# Patient Record
Sex: Female | Born: 1965
Health system: Southern US, Community
[De-identification: ages and names within clinical notes are randomized; demographics above are authoritative.]

## PROBLEM LIST (undated history)

## (undated) DIAGNOSIS — E785 Hyperlipidemia, unspecified: Secondary | ICD-10-CM

## (undated) DIAGNOSIS — F429 Obsessive-compulsive disorder, unspecified: Secondary | ICD-10-CM

## (undated) DIAGNOSIS — F32A Depression, unspecified: Secondary | ICD-10-CM

## (undated) DIAGNOSIS — F329 Major depressive disorder, single episode, unspecified: Secondary | ICD-10-CM

## (undated) DIAGNOSIS — E119 Type 2 diabetes mellitus without complications: Secondary | ICD-10-CM

## (undated) HISTORY — PX: ENDOMETRIAL ABLATION: SHX621

## (undated) HISTORY — DX: Depression, unspecified: F32.A

## (undated) HISTORY — DX: Type 2 diabetes mellitus without complications: E11.9

## (undated) HISTORY — DX: Hyperlipidemia, unspecified: E78.5

## (undated) HISTORY — DX: Major depressive disorder, single episode, unspecified: F32.9

## (undated) HISTORY — DX: Obsessive-compulsive disorder, unspecified: F42.9

## (undated) HISTORY — PX: TUBAL LIGATION: SHX77

---

## 2006-03-12 ENCOUNTER — Other Ambulatory Visit: Admission: RE | Admit: 2006-03-12 | Discharge: 2006-03-12 | Payer: Self-pay | Admitting: Gynecology

## 2010-01-13 ENCOUNTER — Emergency Department (HOSPITAL_COMMUNITY): Admission: EM | Admit: 2010-01-13 | Discharge: 2010-01-13 | Payer: Self-pay | Admitting: Family Medicine

## 2011-02-05 LAB — POCT URINALYSIS DIP (DEVICE)
Bilirubin Urine: NEGATIVE
Glucose, UA: 500 mg/dL — AB
Nitrite: NEGATIVE

## 2011-02-05 LAB — POCT I-STAT, CHEM 8
Hemoglobin: 13.6 g/dL (ref 12.0–15.0)
Sodium: 130 mEq/L — ABNORMAL LOW (ref 135–145)
TCO2: 27 mmol/L (ref 0–100)

## 2011-02-05 LAB — GLUCOSE, CAPILLARY: Glucose-Capillary: 418 mg/dL — ABNORMAL HIGH (ref 70–99)

## 2015-05-31 ENCOUNTER — Other Ambulatory Visit: Payer: Self-pay | Admitting: Family Medicine

## 2015-05-31 ENCOUNTER — Ambulatory Visit
Admission: RE | Admit: 2015-05-31 | Discharge: 2015-05-31 | Disposition: A | Discharge status: Home / Self Care | Payer: 59 | Source: Ambulatory Visit | Attending: Family Medicine | Admitting: Family Medicine

## 2015-05-31 DIAGNOSIS — Z7722 Contact with and (suspected) exposure to environmental tobacco smoke (acute) (chronic): Secondary | ICD-10-CM

## 2015-06-03 ENCOUNTER — Ambulatory Visit (INDEPENDENT_AMBULATORY_CARE_PROVIDER_SITE_OTHER): Payer: 59

## 2015-06-03 ENCOUNTER — Ambulatory Visit (INDEPENDENT_AMBULATORY_CARE_PROVIDER_SITE_OTHER): Payer: 59 | Admitting: Podiatry

## 2015-06-03 ENCOUNTER — Encounter: Payer: Self-pay | Admitting: Podiatry

## 2015-06-03 VITALS — BP 126/80 | HR 77 | Resp 12

## 2015-06-03 DIAGNOSIS — Q667 Congenital pes cavus: Secondary | ICD-10-CM

## 2015-06-03 DIAGNOSIS — M779 Enthesopathy, unspecified: Secondary | ICD-10-CM

## 2015-06-03 DIAGNOSIS — M216X9 Other acquired deformities of unspecified foot: Secondary | ICD-10-CM

## 2015-06-03 DIAGNOSIS — M2040 Other hammer toe(s) (acquired), unspecified foot: Secondary | ICD-10-CM | POA: Diagnosis not present

## 2015-06-03 DIAGNOSIS — R52 Pain, unspecified: Secondary | ICD-10-CM

## 2015-06-03 DIAGNOSIS — M79673 Pain in unspecified foot: Secondary | ICD-10-CM

## 2015-06-03 NOTE — Progress Notes (Signed)
   Subjective:    Patient ID: Brooke Shah, female    DOB: 1966/05/06, 49 y.o.   MRN: [id number]  HPI  49 year old female presents the office with complaints of bilateral fourth toe contractures and pain to the top of the fourth toe. His his been ongoing for approximately 2 years and has been progressive. She gets irritation top the toe from shoe gear. He gets some pain in the top of her right foot which has been ongoing for several months. She denies any history of injury or trauma. Denies any swelling or any redness over the area. She is diabetic and last HbA1c was 9.4. 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 She said no prior treatment for this. No other complaints at this time.  Review of Systems  Constitutional: Positive for unexpected weight change.  All other systems reviewed and are negative.      Objective:   Physical Exam AAO 3, NAD DP/PT pulses palpable, CRT less than 3 seconds Protective sensation intact with Simms Weinstein monofilament, vibratory sensation intact, Achilles tendon reflex intact. There is semirigid hammertoe contractures to bilateral fourth digit with slight derotation over the dorsal aspect of the PIPJ bilaterally. There is hammertoe contractures of the lesser digits however they've noticed that the fourth toes. There is mild discomfort along the dorsal aspect of the right foot on the extensor tendons although the subjectively is improving of the last couple weeks. There is no area pinpoint bony tenderness or pain the vibratory sensation of bilateral lower extremity is. There is an increase in medial arch height upon weightbearing. There is high-pressure areas present bilateral submetatarsal one and 5 with mild hyperkeratotic lesions. There is no surrounding erythema or drainage. MMT 5/5, ROM WNL No open lesions or pre-ulcerative lesions identified elsewhere. No pain with calf compression, swelling, warmth,  erythema.     Assessment & Plan:  49 year old female fourth digit hammertoe contractures bilaterally; , likely extensor tendinitis due to foot type in shoe gear. -X-rays were obtained and reviewed with the patient.  -Treatment options discussed including all alternatives, risks, and complications -I discussed both conservative and surgical treatment options for bilateral hammertoe correction. However her A1c is 9.4 so would not perform surgery with her sugar being is elevated. For now continue conservative treatment including shoe gear modifications, padding and offloading. -Discussed likely etiology of the dorsal foot pain on the right side. This is likely due to tendinitis. She has an increase in arch height. She wears flat shoes or high heels. Discussed with her orthotics to help support her foot take pressure off of this area. May long discussion about this. -Follow-up after she gets orthotics and tries padding for some time. If symptoms aren't resolving in the next 4-6 weeks to call the office for follow-up appointment or sooner if any problems are to arise. In the meantime I encouraged her to call the office with any questions, concerns, changes symptoms.  Ovid Curd, DPM

## 2015-07-22 ENCOUNTER — Encounter: Payer: Self-pay | Admitting: Podiatry

## 2015-07-22 ENCOUNTER — Ambulatory Visit (INDEPENDENT_AMBULATORY_CARE_PROVIDER_SITE_OTHER): Payer: 59 | Admitting: Podiatry

## 2015-07-22 VITALS — BP 114/77 | HR 87 | Resp 18

## 2015-07-22 DIAGNOSIS — M2040 Other hammer toe(s) (acquired), unspecified foot: Secondary | ICD-10-CM

## 2015-07-22 DIAGNOSIS — M216X9 Other acquired deformities of unspecified foot: Secondary | ICD-10-CM

## 2015-07-22 DIAGNOSIS — Q667 Congenital pes cavus: Secondary | ICD-10-CM | POA: Diagnosis not present

## 2015-07-22 DIAGNOSIS — M79673 Pain in unspecified foot: Secondary | ICD-10-CM

## 2015-07-25 NOTE — Progress Notes (Signed)
Patient ID: Brooke Shah, female   DOB: 1966/11/07, 49 y.o.   MRN: [id number]  Subjective: 49 year old female presents the office they for follow-up evaluation of bilateral fourth toe pain which she says is doing better since last appointment. She says the areas continue to curl although they are not as painful. She also gets mild diffuse tenderness to the top of her foot along the midfoot particularly when walking or standing for prolonged periods of time or working as a Financial controller. Denies any swelling or redness. No tingling or numbness. No recent injury or trauma. She has purchased an over-the-counter insert for her sneakers although she states that she cannot wear them in her dress shoes or shoes that she works in. Denies any systemic complaints such as fevers, chills, nausea, vomiting. No acute changes since last appointment, and no other complaints at this time.   Objective: AAO x3, NAD DP/PT pulses palpable bilaterally, CRT less than 3 seconds Protective sensation intact with Dorann Ou monofilament There is an increase in calcaneal inclination angle bilaterally. There is mild diffuse tenderness along the midfoot without any specific pinpoint bony tenderness or pain the vibratory sensation. There is adductovarus the bilateral fourth digits with slight irritation around the PIPJ from shoe gear. There is no tenderness to palpation upon the toes at this time. No other areas of pinpoint bony tenderness or pain with vibratory sensation. MMT 5/5, ROM WNL. No edema, erythema, increase in warmth to bilateral lower extremities.  No open lesions or pre-ulcerative lesions.  No pain with calf compression, swelling, warmth, erythema  Assessment: 49 year old female bilateral foot pain likely result of biomechanical changes/cavus foot, resolving fourth toe pain  Plan: -All treatment options discussed with the patient including all alternatives, risks, complications. I discussed both  conservative and surgical treatment options. -Again discussed etiology of her symptoms. -I believe that she wouldn't mostly benefit from a custom orthotic. I discussed with her a dress orthotic as well. I believe this would help her with her foot type. She is difficult position having to wear high-heeled and flat shoes at work -Follow-up after trying orthotics in dress shoes if she is able to purchase them or sooner if any problems are to arise. In the meantime call the office with any questions, concerns, changes symptoms.  Ovid Curd, DPM

## 2016-05-31 LAB — HEPATIC FUNCTION PANEL
ALT: 16 U/L (ref 7–35)
AST: 16 U/L (ref 13–35)
BILIRUBIN, TOTAL: 0.6 mg/dL

## 2016-05-31 LAB — BASIC METABOLIC PANEL
BUN: 13 mg/dL (ref 4–21)
CREATININE: 0.7 mg/dL (ref 0.5–1.1)
Glucose: 130 mg/dL
POTASSIUM: 5 mmol/L (ref 3.4–5.3)
Sodium: 138 mmol/L (ref 137–147)

## 2016-05-31 LAB — LIPID PANEL
CHOLESTEROL: 189 mg/dL (ref 0–200)
HDL: 3 mg/dL — AB (ref 35–70)
TRIGLYCERIDES: 68 mg/dL (ref 40–160)

## 2016-05-31 LAB — MICROALBUMIN, URINE: Microalb, Ur: 0.7

## 2016-05-31 LAB — HEMOGLOBIN A1C: Hemoglobin A1C: 8.6

## 2016-07-19 ENCOUNTER — Ambulatory Visit (INDEPENDENT_AMBULATORY_CARE_PROVIDER_SITE_OTHER): Payer: 59 | Admitting: Internal Medicine

## 2016-07-19 VITALS — BP 118/82 | HR 87 | Ht 62.0 in | Wt 150.0 lb

## 2016-07-19 DIAGNOSIS — E1065 Type 1 diabetes mellitus with hyperglycemia: Principal | ICD-10-CM

## 2016-07-19 DIAGNOSIS — E109 Type 1 diabetes mellitus without complications: Secondary | ICD-10-CM

## 2016-07-19 DIAGNOSIS — IMO0001 Reserved for inherently not codable concepts without codable children: Secondary | ICD-10-CM

## 2016-07-19 LAB — POCT GLYCOSYLATED HEMOGLOBIN (HGB A1C): HEMOGLOBIN A1C: 8.3

## 2016-07-19 MED ORDER — GLUCAGON (RDNA) 1 MG IJ KIT
1.0000 mg | PACK | Freq: Once | INTRAMUSCULAR | 12 refills | Status: DC | PRN
Start: 2016-07-19 — End: 2017-08-26

## 2016-07-19 NOTE — Patient Instructions (Addendum)
Please change the pump settings as follows: - basal rates: 12 AM: 0.45 units/h >> 0.6 7 AM: 0.80 11 AM: 0.70 6 PM: 0.90 10 PM: 0.75 - ICR: 12, but from 5-9 pm: 11 - target: 110-130 >> 110-120 - ISF: 65 >> 55 - Insulin on Board: 4 hours  Please return in 1.5 months with your sugar log.   Please schedule an appt with Cristy Folks for diabetes education.  Basic Rules for Patients with Type I Diabetes Mellitus  1. The American Diabetes Association (ADA) recommended targets: - fasting sugar <130 - after meal sugar <180 - HbA1C <7%  2. Engage in ?150 min moderate exercise per week  3. Make sure you have ?8h of sleep every night as this helps both blood sugars and your weight.  4. Always keep a sugar log (not only record in your meter) and bring it to all appointments with [place].  5. If you are on a pump, know how to access the settings and to modify the parameters.  6.  Remember, you can always call the number on the back of the pump for emergencies related to the pump.  7. "15-15 rule" for hypoglycemia: if sugars are low, take 15 g of carbs** ("fast sugar" - e.g. 4 glucose tablets, 4 oz orange juice), wait 15 min, then check sugars again. If still <80, repeat. Continue  until your sugars >80, then eat a normal meal.   8. Teach family members and coworkers to inject glucagon. Have a glucagon set at home and one at work. They should call 911 after using the set.  9. If you are on a pump, set "insulin on board" time for 5 hours (if your sugars tend to be higher, can use 4 hours).   10. If you are on a pump, use the "dual wave bolus" setting for high fat foods (e.g. pizza). Start with a setting of 50%-50% (50% instant bolus and 50% prolonged bolus over 3h, for e.g.).    11. If you are on a pump, make sure the basal daily insulin dose is approximately equal (not larger) to the daily insulin you get from boluses, otherwise you are at risk for hypoglycemia.  12. Check sugar before  driving. If <100, correct, and only start driving if sugars rise ?960. Check sugar every hour when on a long drive.  13. Check sugar before exercising. If <100, correct, and only start exercising if sugars rise ?100. Check sugar every hour when on a long exercise routine and 1h after you finished exercising.   If >250, check urine for ketones. If you have moderate-large ketones in urine, do not start exercise. Hydrate yourself with clear liquids and correct the high sugar. Recheck sugars and ketones before attempting to exercise.  Be aware that you might need less insulin when exercising.  *intense, short, exercise bursts can increase your sugars, but  *less intense, longer (>1h), exercise routines can decrease your sugars.  If you are on a pump, you might need to decrease your basal rate by 10% or more (or even disconnect your pump) while you exercise to prevent low sugars. Do not disconnect your pump by more than 3 hours at a time! You also might need to decrease your insulin bolus for the meal prior to your exercise time by 20% or more.  14. Make sure you have a MedAlert bracelet or pendant mentioning "Type I Diabetes Mellitus". If you have a prior episode of severe hypoglycemia or hypoglycemia unawareness, it should  also mention this.  15. Please do not walk barefoot. Inspect your feet for sores/cuts and let us know if you have them.  16. Please call [facility] with any questions and concerns [phone]).   **E.g. of "fast carbs": ? first choice (15 g):  1 tube glucose gel, GlucoPouch 15, 2 oz glucose liquid ? second choice (15-16 g):  3 or 4 glucose tablets (best taken  with water), 15 Dextrose Bits chewable ? third choice (15-20 g):   cup fruit juice,  cup regular soda, 1 cup skim milk,  1 cup sports drink ? fourth choice (15-20 g):  1 small tube [place] gel (not frosting), 2 tbsp raisins, 1 tbsp table sugar,  candy, jelly beans, gum drops - check package for  carb amount   (adapted from: Juluis RainierMcCall A.L. "Insulin therapy and hypoglycemia" Endocrinol Metab Clin N Am 2012, 41: 57-87)

## 2016-07-19 NOTE — Progress Notes (Signed)
Patient ID: Brooke Shah, female   DOB: 07-12-1966, 50 y.o.   MRN: [id number]  HPI: Brooke Shah is a 50 y.o.-year-old female, referred by her PCP, Brooke Huh, MD, for management of DM1, dx 2004, uncontrolled, without complications.  Patient has been diagnosed with diabetes in 2004; she started on insulin pump 2006.  Last hemoglobin A1c was: 01/11/2016: HbA1c 8.7% 09/29/2015: HbA1c 8.9% 09/28/2014: HbA1c 9.8%  She had positive pancreatic antibodies.   Pt is on an insulin pump: [employer], with Dexcom CGM (not on now), uses Humalog in the pump.  Pump settings: - basal rates: 12 AM: 0.45 units/h 7 AM: 0.80 11 AM: 0.70 6 PM: 0.90 10 PM: 0.75 - ICR: 12 - target: 110-130 - ISF: 65 - Insulin on Board: 4 hours - bolus wizard: on TDD from basal insulin: 57% (16.3 units) TDD from bolus insulin: 43% (12.1 units) - extended bolusing: not using - changes infusion site: q5-6 days! - Meter: One Touch Unltra link  Pt checks her sugars 3x a day and they are (will also scan report): - am: 132, 161-315 - 2h after b'fast: 115-198 - before lunch: Usually not checking - 2h after lunch: 215-265 - before dinner: 69, 127, 236-361 - 2h after dinner: 111, 362, 383 - bedtime: 108, 148- 359 - nighttime: n/c No lows. Lowest sugar was 33 >> but 50s recently; she has hypoglycemia awareness at 70. No previous hypoglycemia admission. Does not have a glucagon kit at home. Highest sugar was 300s No previous DKA admissions.    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  - no CKD, last BUN/creatinine:  07/19/2015: 11/0.68, normal LFTs Lab Results  Component Value Date   BUN 16 01/13/2010   CREATININE 0.9 01/13/2010  ACR normal 07/2015 - last set of lipids: 10/04/2015: 195/86/76/102 No results found for: CHOL, HDL, LDLCALC, LDLDIRECT, TRIG, CHOLHDL - last eye exam was in 03/2016. No DR.  - + numbness and tingling in her feet. B12 level 648 07/19/2015.  Last  TSH: 10/04/2015: TSH 0.780, free T4 0.90 No results found for: TSH  No FH of DM.  ROS: Constitutional: + weight gain, no fatigue, no subjective hyperthermia/hypothermia Eyes: no blurry vision, no xerophthalmia ENT: no sore throat, no nodules palpated in throat, no dysphagia/odynophagia, no hoarseness Cardiovascular: no CP/SOB/palpitations/leg swelling Respiratory: no cough/SOB Gastrointestinal: no N/V/D/C Musculoskeletal: no muscle/joint aches Skin: no rashes Neurological: no tremors/numbness/tingling/dizziness Psychiatric: no depression/anxiety  PMH: - DM1 - HL - depression   No past surgical history on file.   Social History   Social History  . Marital status: Married    Spouse name: N/A  . Number of children:    Occupational History  . Flight attendant   Social History Main Topics  . Smoking status: Never Smoker  . Smokeless tobacco: Not on file  . Alcohol use No  . Drug use: No   Current Outpatient Prescriptions on File Prior to Visit  Medication Sig Dispense Refill  . atorvastatin (LIPITOR) 10 MG tablet Take 10 mg by mouth.    . insulin lispro (HUMALOG) 100 UNIT/ML injection Inject into the skin.     No current facility-administered medications on file prior to visit.    No Known Allergies   FH: - see HPI  PE: BP 118/82 (BP Location: Left Arm, Patient Position: Sitting)   Pulse 87   Ht '5\' 2"'  (1.575 m)   Wt 150 lb (68 kg)   SpO2 95%   BMI 27.44 kg/m  Wt Readings  from Last 3 Encounters:  07/20/16 150 lb (68 kg)   Constitutional: overweight, in NAD Eyes: PERRLA, EOMI, no exophthalmos ENT: moist mucous membranes, no thyromegaly, no cervical lymphadenopathy Cardiovascular: RRR, No MRG Respiratory: CTA B Gastrointestinal: abdomen soft, NT, ND, BS+ Musculoskeletal: no deformities, strength intact in all 4 Skin: moist, warm, no rashes Neurological: no tremor with outstretched hands, DTR normal in all 4  ASSESSMENT: 1. DM1, uncontrolled, without  complications  PLAN:  1. Patient with long-standing, uncontrolled DM1, on insulin pump therapy. She was diagnosed with type 1 diabetes later in life, and is still having trouble adapting to this diagnosis. She is on insulin pump, but is not very proficient in working with it, and she would not change settings by herself. Other possible barriers to good control:   She is changing her infusion site every 5 or more days, which I advised her to change to 3, maximum 4 days  She is not entering all the carbs that she eats in the pump  I feel that she has trouble calculating the carbs in her meals  She is not bolusing with all of her meals  She would like to keep her sugars on the high side, for fear of dropping especially overnight  She may overcompensate the lows >> We discussed about the 1515 rule   she is not proficient in extended boluses. We discussed about doing wave and square wave boluses today briefly, but will need more training >> will refer to diabetes education  - As the sugars are high in the morning, even though they may be at goal at bedtime, will increase her basal rates overnight  - Since she has to do multiple boluses to bring the sugars down when hyperglycemic, I will lower her sensitivity factor  - As her sugars are higher after dinner, will lower the ICR with this meal  - will also decrease her hyperglycemic target - HbA1c today: 8.4% (better). - We discussed about changes to his insulin regimen, as follows:  Patient Instructions  Please change the pump settings as follows: - basal rates: 12 AM: 0.45 units/h >> 0.6 7 AM: 0.80 11 AM: 0.70 6 PM: 0.90 10 PM: 0.75 - ICR: 12, but from 5-9 pm: 11 - target: 110-130 >> 110-120 - ISF: 65 >> 55 - Insulin on Board: 4 hours  Please return in 1.5 months with your sugar log.   Please schedule an appt with Brooke Shah for diabetes education.  - Continue checking sugars at different times of the day - check at least 4-6  times a day, rotating checks.  - given foot care handout and explained the principles  - given instructions for hypoglycemia management "15-15 rule"  - advised for yearly eye exams - sent 2x glucagon kit Rx's to pharmacy - advised to get ketone strips - advised to always have Glu tablets with her - advised for a Med-alert bracelet mentioning "type 1 diabetes mellitus". - will refer to DM education for further help with the pump: carb counting check, basal rate validation, extended bolusing, sick days rules, etc. - given instruction Re: exercising and driving in DM1 (pt instructions) - no signs of other autoimmune disorders - Return to clinic in 1.5 mo with sugar log   - time spent with the patient: 1 hour, of which >50% was spent in obtaining information about herdisease, reviewing previous labs, office visit notes, hospitalization records, and DM treatments, counseling pt about her condition (please see the discussed topics above), and  developing a plan to prevent further hypoglycemia and hyperglycemia. We also discussed about proper diet. Pt had a number of questions which I addressed.  Received labs from PCP after the visit: 05/31/2016: - HbA1c 8.6% - BUN/creatinine 13/0.67, GFR 94. Normal AST and ALT. Glucose 130. - ACR unable to calculate (microalbumin <0.7 mg/dL) - Lipids: 189/68/67/108.  Philemon Kingdom, MD PhD [facility]

## 2016-07-20 ENCOUNTER — Encounter: Payer: Self-pay | Admitting: Internal Medicine

## 2016-07-24 ENCOUNTER — Encounter: Payer: 59 | Admitting: Nutrition

## 2016-08-01 ENCOUNTER — Encounter: Payer: 59 | Attending: Internal Medicine | Admitting: Nutrition

## 2016-08-01 DIAGNOSIS — Z713 Dietary counseling and surveillance: Secondary | ICD-10-CM | POA: Diagnosis not present

## 2016-08-01 DIAGNOSIS — IMO0002 Reserved for concepts with insufficient information to code with codable children: Secondary | ICD-10-CM

## 2016-08-01 DIAGNOSIS — E1065 Type 1 diabetes mellitus with hyperglycemia: Secondary | ICD-10-CM

## 2016-08-01 DIAGNOSIS — E108 Type 1 diabetes mellitus with unspecified complications: Secondary | ICD-10-CM

## 2016-08-01 DIAGNOSIS — E109 Type 1 diabetes mellitus without complications: Secondary | ICD-10-CM | POA: Diagnosis not present

## 2016-08-01 NOTE — Patient Instructions (Signed)
Take the amount of insulin the pump says to take for correction and meal boluses Test  Before meals and at bedtime Call results to me on Monday

## 2016-08-01 NOTE — Progress Notes (Signed)
Meter download shows blood sugars for the last week: FBSs: 191-260,  AcL; 259-over 400,  AcS: 211-380 and one reading: 70 HS: 157-300  Blood sugars very verable. Pt. Says she is not taking what the pump tells her to take.  Also complaining that she has gained 40 pounds in the last 2 years.  She believes she has to eat q 2 hours, because she was told that when she was a child.  Discussed that it is not necessary to eat q 2 hours, and it is very important to take what the pump says to take to know if we need to change the settings.  She can see that by reducing the boluses by 0.5-1unit, her blood sugars still remain high.  She promised me to do what the pump says, and to call me with blood sugar readings on Monday morning.

## 2016-09-11 ENCOUNTER — Other Ambulatory Visit: Payer: Self-pay

## 2016-09-11 ENCOUNTER — Ambulatory Visit (INDEPENDENT_AMBULATORY_CARE_PROVIDER_SITE_OTHER): Payer: 59 | Admitting: Internal Medicine

## 2016-09-11 ENCOUNTER — Encounter: Payer: Self-pay | Admitting: Internal Medicine

## 2016-09-11 VITALS — BP 122/84 | HR 88 | Ht 62.0 in | Wt 152.0 lb

## 2016-09-11 DIAGNOSIS — IMO0001 Reserved for inherently not codable concepts without codable children: Secondary | ICD-10-CM

## 2016-09-11 DIAGNOSIS — E1065 Type 1 diabetes mellitus with hyperglycemia: Secondary | ICD-10-CM | POA: Diagnosis not present

## 2016-09-11 MED ORDER — ATORVASTATIN CALCIUM 10 MG PO TABS: ORAL_TABLET | ORAL | 3 refills | Status: DC

## 2016-09-11 MED ORDER — ONETOUCH DELICA LANCETS FINE MISC: 3 refills | Status: DC

## 2016-09-11 MED ORDER — GLUCOSE BLOOD VI STRP: ORAL_STRIP | 3 refills | Status: DC

## 2016-09-11 NOTE — Patient Instructions (Signed)
Please change the settings: - basal rates: 12 AM: 0.6 units/h >> 0.75 7 AM: 0.80 >> 0.90 11 AM: 0.70 >> 0.90 6 PM: 0.90 >> 1.00 10 PM: 0.75 >> 0.85 - ICR: 12 >> 11. If sugars still high after a meal >> decrease further, to 10. but from 5-9 pm: 11 - target: 110-120 - ISF: 55 - Insulin on Board: 4 hours  Please return in 3 months with your sugar log.

## 2016-09-11 NOTE — Progress Notes (Signed)
Patient ID: Brooke Shah, female   DOB: 23-May-1966, 50 y.o.   MRN: [id number]  HPI: Brooke Shah is a 50 y.o.-year-old female, initially referred by her PCP, Brooke Huh, MD, for management of DM1, dx 2004, uncontrolled, without complications. Last visit 2 mo ago.  Patient has been diagnosed with diabetes in 2004; she started on insulin pump 2006 - new pump 10/16/2013: Revel 523.  Last hemoglobin A1c was: Lab Results  Component Value Date   HGBA1C 8.3 07/19/2016   HGBA1C 8.6 05/31/2016  05/31/2016: HbA1c 8.6% 01/11/2016: HbA1c 8.7% 09/29/2015: HbA1c 8.9% 09/28/2014: HbA1c 9.8%  She had positive pancreatic antibodies.   Pt is on an insulin pump: Medtronic, with Dexcom CGM (not on now), uses Humalog in the pump.  Pump settings: - basal rates: 12 AM: 0.45 units/h >> 0.6 7 AM: 0.80 11 AM: 0.70 6 PM: 0.90 10 PM: 0.75 - ICR: 12, but from 5-9 pm: 11 - target: 110-130 >> 110-120 - ISF: 65 >> 55 - Insulin on Board: 4 hours TDD from basal insulin: 47% (17.4 units) TDD from bolus insulin: 53% (19.8 units) - extended bolusing: not using - changes infusion site: q5-6 days! >> q3-4 days now - Meter: One Touch Ultra link  Pt checks her sugars 3x a day and they are (will also scan report) >> high ave: 238 +/- 86: - am: 132, 161-315 >> 139-284 - 2h after b'fast: 115-198 >> 74 x1 - before lunch: Usually not checking >> 118-388 - 2h after lunch: 215-265 >> 136-309 - before dinner: 69, 127, 236-361 >> 71, 118-332 - 2h after dinner: 111, 362, 383 >> 125-361 - bedtime: 108, 148- 359 >> see above - nighttime: n/c No lows. Lowest sugar was 33 >> 50s >> 70s; she has hypoglycemia awareness at 70. No previous hypoglycemia admission. Does not have a glucagon kit at home. Highest sugar was 300s. No previous DKA admissions.    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  - no CKD, last BUN/creatinine:  05/31/2016: 13/0.67, GFR 94. Normal AST and ALT. Glucose 130, ACR  unable to calculate (microalbumin <0.7 mg/dL) 07/19/2015: 11/0.68, normal LFTs Lab Results  Component Value Date   BUN 13 05/31/2016   BUN 16 01/13/2010   CREATININE 0.7 05/31/2016   CREATININE 0.9 01/13/2010  ACR normal 07/2015 - last set of lipids: 05/31/2016: 189/68/67/108. 10/04/2015: 195/86/76/102 Lab Results  Component Value Date   CHOL 189 05/31/2016   HDL 3 (A) 05/31/2016   TRIG 68 05/31/2016   - last eye exam was in 03/2016. No DR.  - + numbness and tingling in her feet. B12 level 648 07/19/2015.  Last TSH: 10/04/2015: TSH 0.780, free T4 0.90 No results found for: TSH  No FH of DM.  ROS: Constitutional: + weight gain, no fatigue, no subjective hyperthermia/hypothermia Eyes: no blurry vision, no xerophthalmia ENT: no sore throat, no nodules palpated in throat, no dysphagia/odynophagia, no hoarseness Cardiovascular: no CP/SOB/palpitations/leg swelling Respiratory: no cough/SOB Gastrointestinal: no N/V/D/C Musculoskeletal: no muscle/joint aches Skin: no rashes Neurological: no tremors/numbness/tingling/dizziness  I reviewed pt's medications, allergies, PMH, social hx, family hx, and changes were documented in the history of present illness. Otherwise, unchanged from my initial visit note.  PMH: - DM1 - HL - depression   No past surgical history on file.   Social History   Social History  . Marital status: Married    Spouse name: N/A  . Number of children:    Occupational History  . Flight attendant  Social History Main Topics  . Smoking status: Never Smoker  . Smokeless tobacco: Not on file  . Alcohol use No  . Drug use: No   Current Outpatient Prescriptions on File Prior to Visit  Medication Sig Dispense Refill  . ALPRAZolam (XANAX) 0.5 MG tablet     . atorvastatin (LIPITOR) 10 MG tablet Take 10 mg by mouth.    . Clindamycin-Benzoyl Per, Refr, gel     . insulin lispro (HUMALOG) 100 UNIT/ML injection Inject subcutaneously 100  units (76m)  daily (load  pump with 300 units every 3 days)    . Phentermine-Topiramate 3.75-23 MG CP24 Take by mouth.    . sertraline (ZOLOFT) 100 MG tablet     . valACYclovir (VALTREX) 1000 MG tablet     . glucagon (GLUCAGON EMERGENCY) 1 MG injection Inject 1 mg into the muscle once as needed. (Patient not taking: Reported on 09/11/2016) 2 each 12  . insulin lispro (HUMALOG) 100 UNIT/ML injection Inject into the skin.     No current facility-administered medications on file prior to visit.    No Known Allergies   FH: - see HPI  PE: BP 122/84 (BP Location: Left Arm, Patient Position: Sitting)   Pulse 88   Ht '5\' 2"'  (1.575 m)   Wt 152 lb (68.9 kg)   SpO2 97%   BMI 27.80 kg/m  Wt Readings from Last 3 Encounters:  09/11/16 152 lb (68.9 kg)  07/20/16 150 lb (68 kg)   Constitutional: overweight, in NAD Eyes: PERRLA, EOMI, no exophthalmos ENT: moist mucous membranes, no thyromegaly, no cervical lymphadenopathy Cardiovascular: RRR, No MRG Respiratory: CTA B Gastrointestinal: abdomen soft, NT, ND, BS+ Musculoskeletal: no deformities, strength intact in all 4 Skin: moist, warm, no rashes Neurological: no tremor with outstretched hands, DTR normal in all 4  ASSESSMENT: 1. DM1, uncontrolled, without complications  PLAN:  1. Patient with long-standing, uncontrolled DM1, on insulin pump therapy. She is not very proficient in change in the settings by herself, but she did this today with minimum supervision. Other possible barriers to good control >> improved:  She was changing her infusion site every 5 or more days >> now every 3, maximum 4 days  She was not entering all the carbs that she eats in the pump >>  now doing much better   At last visit, I felt  that she had trouble calculating the carbs in her meals >> she is using the: "My fitness pal" app  She is not bolusing with all of her meals >> this has also improved   She would like to keep her sugars on the high side, for fear of  dropping especially overnight   She may overcompensate the lows   she is not proficient in extended boluses. We discussed about doing wave and square wave boluses today briefly, but will need more training >> saw LLeonia Reader- As the sugars are high throughout the day >> will again increase her basal rates overnight  - Since she has to do multiple boluses to bring the sugars down when hyperglycemic, I will decrease her b'fast and lunch ICR - also discussed about the new Medtronic pump. She is not very keen on the CGM part, but I really think that the new pump can help her gain better control. However, she still is warranty with this current pump for the one more year. - reviewed last HbA1c: 8.4% (better) - We discussed about changes to his insulin regimen, as follows:  Patient  Instructions  Please change the settings: - basal rates: 12 AM: 0.6 units/h >> 0.75 7 AM: 0.80 >> 0.90 11 AM: 0.70 >> 0.90 6 PM: 0.90 >> 1.00 10 PM: 0.75 >> 0.85 - ICR: 12 >> 11. If sugars still high after a meal >> decrease further, to 10. but from 5-9 pm: 11 - target: 110-120 - ISF: 55 - Insulin on Board: 4 hours  Please return in 3 months with your sugar log.   - Continue checking sugars at different times of the day - check at least 4-6 times a day, rotating checks.  - advised for yearly eye exams - sent 2x glucagon kit Rx's to pharmacy at last visit - refuses a flu shot today - no signs of other autoimmune disorders - Return to clinic in 3 mo with sugar log   - time spent with the patient: 40 min, of which >50% was spent in reviewing her pump downloads, discussing her hypo- and hyper-glycemic episodes, reviewing previous labs and pump settings and developing a plan to avoid hypo- and hyper-glycemia.   Philemon Kingdom, MD PhD [facility] Endocrinology

## 2016-09-12 ENCOUNTER — Telehealth: Payer: Self-pay

## 2016-09-12 ENCOUNTER — Telehealth: Payer: Self-pay | Admitting: Family Medicine

## 2016-09-12 NOTE — Telephone Encounter (Signed)
Sorry to hear that. If she did not overbolus with dinner or overcorrected a high at bedtime, please ask her to back off on the nighttime basal rates from 0.75 to 0.70.

## 2016-09-12 NOTE — Telephone Encounter (Signed)
Patient wanted to let the doctor know her blood sugar level dropped to 44 at 3:30am this morning.

## 2016-09-12 NOTE — Telephone Encounter (Signed)
Called patient and notified on the change of basal rate as patient did not overcorrect. Patient will try to change this and will notify us if any highs or lows occur.

## 2016-09-14 ENCOUNTER — Encounter: Payer: 59 | Attending: Internal Medicine | Admitting: Dietician

## 2016-09-14 ENCOUNTER — Encounter: Payer: Self-pay | Admitting: Dietician

## 2016-09-14 DIAGNOSIS — Z713 Dietary counseling and surveillance: Secondary | ICD-10-CM | POA: Diagnosis present

## 2016-09-14 DIAGNOSIS — E109 Type 1 diabetes mellitus without complications: Secondary | ICD-10-CM | POA: Insufficient documentation

## 2016-09-14 DIAGNOSIS — E1065 Type 1 diabetes mellitus with hyperglycemia: Secondary | ICD-10-CM

## 2016-09-14 NOTE — Progress Notes (Signed)
Diabetes Self-Management Education  Visit Type: Follow-up  Appt. Start Time: 1300 Appt. End Time: 1430  09/14/2016  Brooke Shah, identified by name and date of birth, is a 50 y.o. female with a diagnosis of Diabetes: Type 1. Humalog via insulin pump.  Patient complains of a 25 lb weight gain in the past 2 years.  She stated that she was usually around 133 and reduced to 130 lbs on Invokana then gained weight after she discontinued this medication.  She has recently seen the CDE in this office for help with her pump and insulin education.  She is here today for a review about carbohydrate counting and get an updated meal plan for weight management.  She would like to lose to 135 lbs.  She reports walking for 3 miles for 4-5 times per week but has not lost weight.  Her highest weight is today's weight.  She states that she is a "picky eater" and does not like a lot of meat.  Her A1C has been improving and was 8.3% 07/19/16.  Labs noted to include:  Cholesterol 189, triglycerides 68 and HDL 3 ? Accuracy.  She states that she has started to reduce her snacking and continues at times to not put in all of the carbohydrate that she is eating for fear of a low blood sugar.  Patient lives with her husband and two teen children.  They share shopping and cooking.  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  She at times only gets 5 hours of sleep per night although tries to get more most often.    ASSESSMENT  Height 5\' 2"  (1.575 m), weight 153 lb (69.4 kg). Body mass index is 27.98 kg/m.      Diabetes Self-Management Education - 09/14/16 1316      Visit Information   Visit Type Follow-up     Initial Visit   Diabetes Type Type 1   Are you currently following a meal plan? No   Are you taking your medications as prescribed? No  She states that she has fears of lows and is trying to do better with entering her carb better.   Date Diagnosed 2004   insulin pump since 2006     Health Coping   How would you rate your overall health? Good     Psychosocial Assessment   Patient Belief/Attitude about Diabetes Motivated to manage diabetes   Self-care barriers None   Self-management support Doctor's office;Family   Other persons present Patient   Patient Concerns Nutrition/Meal planning;Weight Control;Glycemic Control   Special Needs None   Preferred Learning Style No preference indicated   Learning Readiness Ready   How often do you need to have someone help you when you read instructions, pamphlets, or other written materials from your doctor or pharmacy? 1 - Never   What is the last grade level you completed in school? 2 years college     Complications   Last HgB A1C per patient/outside source 8.3 %  07/19/16   How often do you check your blood sugar? > 4 times/day   Fasting Blood glucose range (mg/dL) 161-096;[id number];70-129   Postprandial Blood glucose range (mg/dL) >981;191-478>200;180-200   Number of hypoglycemic episodes per month 2   Can you tell when your blood sugar is low? Yes   What do you do if your blood sugar is low? drinks 6-8 ounces juice or eats glucose tabs   Number of hyperglycemic episodes  per week 7   Can you tell when your blood sugar is high? Yes   What do you do if your blood sugar is high? give a correction dose of insulin   Have you had a dilated eye exam in the past 12 months? Yes   Have you had a dental exam in the past 12 months? Yes   Are you checking your feet? Yes   How many days per week are you checking your feet? 7     Dietary Intake   Breakfast cheese toast or protein shake or egg white with toast and tomatoes or occasionally french toast, pancake or waffle OR quick or instant oatmeal with peanut butter   Snack (morning) (P)  occasional almonds or other nuts or fruit or 1/2 protein bar   Lunch (P)  sandwich (PB&J or [place]urkey) OR pizza   Snack (afternoon) (P)  chips or applesauce   Dinner (P)  chicken, rice,  cauliflower OR just vegetables OR protein shake (Juice plus, banana, chia seeds) OR [place]ican foodor italina foods   Snack (evening) (P)  "try not to"- only if blood sugar drops   Beverage(s) (P)  water, diet coke, diet Mt. Dew, green tea     Exercise   Exercise Type (P)  Light (walking / raking leaves)  walking 4-5 times/ week 3.2 miles for past 6 weeks   How many days per week to you exercise? (P)  5   How many minutes per day do you exercise? (P)  60   Total minutes per week of exercise (P)  300     Patient Education   Previous Diabetes Education (P)  Yes (please comment)   Disease state  (P)  --  when diagnosed and 2 months ago     Subsequent Visit   Since your last visit have you continued or begun to take your medications as prescribed? Yes   Since your last visit have you had your blood pressure checked? Yes   Is your most recent blood pressure lower, unchanged, or higher since your last visit? Higher   Since your last visit have you experienced any weight changes? Gain   Weight Gain (lbs) 3   Since your last visit, are you checking your blood glucose at least once a day? Yes      Individualized Plan for Diabetes Self-Management Training:   Learning Objective:  Patient will have a greater understanding of diabetes self-management. Patient education plan is to attend individual and/or group sessions per assessed needs and concerns. Discussed mindful eating.   Plan:   Patient Instructions  Eat slowly and stop when you are satisfied. If you are hungry, a small snack is fine.  It can be just protein or a small portion of protein and a carbohydrate. Before you eat a snack, ask, "Am I hungry or eating for another reason?" Continue your active lifestyle.  Plan:  Aim for 2-3 Carb Choices per meal (30-45 grams) +/- 1 either way  Aim for 0-1 Carbs per snack if hungry  Include protein in moderation with your meals and snacks Consider reading food labels for Total Carbohydrate  and Fat Grams of foods Consider checking BG as directed by MD  Consider taking medication as directed by MD   Expected Outcomes:   Patient expressed interest in learning, good outcome expected  Education material provided: Food label handouts, Meal plan card, My Plate and Snack sheet  If problems or questions, patient to contact team via:  Phone  and Email  Future DSME appointment:  PRN

## 2016-09-14 NOTE — Patient Instructions (Signed)
Eat slowly and stop when you are satisfied. If you are hungry, a small snack is fine.  It can be just protein or a small portion of protein and a carbohydrate. Before you eat a snack, ask, "Am I hungry or eating for another reason?" Continue your active lifestyle.  Plan:  Aim for 2-3 Carb Choices per meal (30-45 grams) +/- 1 either way  Aim for 0-1 Carbs per snack if hungry  Include protein in moderation with your meals and snacks Consider reading food labels for Total Carbohydrate and Fat Grams of foods Consider checking BG as directed by MD  Consider taking medication as directed by MD

## 2016-12-10 LAB — HM DIABETES EYE EXAM

## 2016-12-12 ENCOUNTER — Ambulatory Visit (INDEPENDENT_AMBULATORY_CARE_PROVIDER_SITE_OTHER): Payer: 59 | Admitting: Internal Medicine

## 2016-12-12 ENCOUNTER — Encounter: Payer: Self-pay | Admitting: Internal Medicine

## 2016-12-12 VITALS — BP 122/82 | HR 94 | Ht 62.0 in | Wt 150.0 lb

## 2016-12-12 DIAGNOSIS — E1065 Type 1 diabetes mellitus with hyperglycemia: Secondary | ICD-10-CM | POA: Diagnosis not present

## 2016-12-12 LAB — POCT GLYCOSYLATED HEMOGLOBIN (HGB A1C): HEMOGLOBIN A1C: 8.1

## 2016-12-12 NOTE — Progress Notes (Signed)
Patient ID: Malli Falotico, female   DOB: 1966/06/17, 51 y.o.   MRN: [id number]  HPI: Patricie Geeslin is a 51 y.o.-year-old female, initially referred by her PCP, Thora Lance, MD, for management of DM1, dx 2004, uncontrolled, without complications. Last visit 3 mo ago.  Patient started on insulin pump 2006 - new pump 10/16/2013: Revel 523, with Dexcom CGM (not on now), uses Humalog in the pump.  Last hemoglobin A1c was: Lab Results  Component Value Date   HGBA1C 8.3 07/19/2016   HGBA1C 8.6 05/31/2016  05/31/2016: HbA1c 8.6% 01/11/2016: HbA1c 8.7% 09/29/2015: HbA1c 8.9% 09/28/2014: HbA1c 9.8%  She had positive pancreatic antibodies.   Pump settings: - basal rates: 12 AM: 0.6 units/h >> 0.75 >> 0.70 7 AM: 0.80 >> 0.90 >> 0.80 11 AM: 0.70 >> 0.90 6 PM: 0.90 >> 1.00 >> 0.925 10 PM: 0.75 >> 0.85 >> 0.800 - ICR: 12 >> 11 >> 12 but from 5-9 pm: 12 >> 11 >> 12 - target: 110-120 - ISF: 55 >> 60 - Insulin on Board: 4 hours  TDD from basal insulin: 47% (17.4 units) >> 60% TDD from bolus insulin: 53% (19.8 units) >> 40% - extended bolusing: not using - changes infusion site: q 4-7 days! - Meter: One Touch Ultra link  Pt checks her sugars 3x a day and they are (will also scan report) >> high ave: 238 +/- 86 >> 192 +/- 108 - am: 132, 161-315 >> 139-284 >> 90-275, 371 - 2h after b'fast: 115-198 >> 74 x1 >> n/c - snacks frequently - before lunch: Usually not checking >> 118-388 >> 61, 218-267, 339 - 2h after lunch: 215-265 >> 136-309 >> 159-277 - before dinner: 69, 127, 236-361 >> 71, 118-332 >> 50, 74-305, 373 - 2h after dinner: 111, 362, 383 >> 125-361 >> 55, 164->400 - bedtime: 108, 148- 359 >> see above  - nighttime: n/c >> 37, 60-390 No lows. Lowest sugar was 33 >> 50s >> 70s >> 37-after correction of a high CBG; she has hypoglycemia awareness at 70. No previous hypoglycemia admission.  Highest sugar was 300s >> >400. No previous DKA admissions.    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  - no CKD, last BUN/creatinine:  05/31/2016: 13/0.67, GFR 94. Normal AST and ALT. Glucose 130, ACR unable to calculate (microalbumin <0.7 mg/dL) 78/29/5621: 30/8.65, normal LFTs Lab Results  Component Value Date   BUN 13 05/31/2016   BUN 16 01/13/2010   CREATININE 0.7 05/31/2016   CREATININE 0.9 01/13/2010  - last set of lipids: 05/31/2016: 189/68/67/108. 10/04/2015: 195/86/76/102 Lab Results  Component Value Date   CHOL 189 05/31/2016   HDL 3 (A) 05/31/2016   TRIG 68 05/31/2016   - last eye exam was on 12/10/2016. No DR.  - + numbness and tingling in her feet. B12 level 648 07/19/2015.  Last TSH: 10/04/2015: TSH 0.780, free T4 0.90 No results found for: TSH  Since last visit, started Wellbutrin.  ROS: Constitutional: no weight gain, no fatigue, no subjective hyperthermia/hypothermia Eyes: no blurry vision, no xerophthalmia ENT: no sore throat, no nodules palpated in throat, no dysphagia/odynophagia, no hoarseness Cardiovascular: no CP/SOB/palpitations/leg swelling Respiratory: no cough/SOB Gastrointestinal: no N/V/D/C/+ heartburn Musculoskeletal: no muscle/+ joint aches Skin: no rashes Neurological: no tremors/numbness/tingling/dizziness  I reviewed pt's medications, allergies, PMH, social hx, family hx, and changes were documented in the history of present illness. Otherwise, unchanged from my initial visit note.  PMH: - DM1 - HL - depression   No past  surgical history on file.   Social History   Social History  . Marital status: Married    Spouse name: N/A  . children: yes   Occupational History  . Flight attendant   Social History Main Topics  . Smoking status: Never Smoker  . Smokeless tobacco: Not on file  . Alcohol use No  . Drug use: No   Current Outpatient Prescriptions on File Prior to Visit  Medication Sig Dispense Refill  . ALPRAZolam (XANAX) 0.5 MG tablet     . atorvastatin (LIPITOR) 10 MG tablet  Take one tablet daily. 90 tablet 3  . glucagon (GLUCAGON EMERGENCY) 1 MG injection Inject 1 mg into the muscle once as needed. 2 each 12  . glucose blood (ONE TOUCH ULTRA TEST) test strip Use as instructed to check sugar 3 times daily. 300 each 3  . insulin lispro (HUMALOG) 100 UNIT/ML injection Inject subcutaneously 100  units (1ml) daily (load  pump with 300 units every 3 days)    . magnesium oxide (MAG-OX) 400 MG tablet Take 400 mg by mouth daily.    . Nutritional Supplements (JUICE PLUS FIBRE PO) Take by mouth.    Letta Pate. ONETOUCH DELICA LANCETS FINE MISC Used to check sugar 3 times daily. 300 each 3  . Probiotic Product (PROBIOTIC-10 PO) Take by mouth.    . sertraline (ZOLOFT) 100 MG tablet     . valACYclovir (VALTREX) 1000 MG tablet     . Phentermine-Topiramate 3.75-23 MG CP24 Take by mouth.     No current facility-administered medications on file prior to visit.    No Known Allergies   FH: - see HPI  PE: BP 122/82 (BP Location: Left Arm, Patient Position: Sitting)   Pulse 94   Ht 5\' 2"  (1.575 m)   Wt 150 lb (68 kg)   SpO2 98%   BMI 27.44 kg/m  Wt Readings from Last 3 Encounters:  12/12/16 150 lb (68 kg)  09/14/16 153 lb (69.4 kg)  09/11/16 152 lb (68.9 kg)   Constitutional: overweight, in NAD Eyes: PERRLA, EOMI, no exophthalmos ENT: moist mucous membranes, no thyromegaly, no cervical lymphadenopathy Cardiovascular: RRR, No MRG Respiratory: CTA B Gastrointestinal: abdomen soft, NT, ND, BS+ Musculoskeletal: no deformities, strength intact in all 4 Skin: moist, warm, no rashes Neurological: no tremor with outstretched hands, DTR normal in all 4  ASSESSMENT: 1. DM1, uncontrolled, without complications  PLAN:  1. Patient with long-standing, uncontrolled DM1, on insulin pump therapy. At this visit, her control is slightly better than before, but sugars are still fluctuating. Sugars are highest around lunchtime (usually after lunch), and after dinner. She tries to correct the  high blood sugars and she subsequently develops lows. She tends to over correct the lows and we discussed about limiting carb intake to 15-30 g of carbs depending on the sugar value. - I advised her to try to have a schedule for her mealtimes, ideally 5 hours apart and to always check her sugars, enter the carbs, and bolus before these. For the snacks, I advised her to only enter the carbs and bolus, without checking sugars. - I advised her to decrease her insulin to carb ratio with lunch and also increase her sensitivity factor to avoid significant blood sugar drops after correction. - There are also other barriers to good control:  She got back to changing her infusion site every 4 or more days  She she is still not entering all the carbs that she eats in the pump  She is not bolusing with all of her meals  She has fluctuating mealtimes and varies the amount of food with each meal. Also, she is not doing extended boluses.  She may overcompensate the lows   She has a CGM but is not using it - We discussed about changes to her insulin regimen, as follows:  Patient Instructions  Please change the settings: - basal rates: 12 AM: 0.70 units/h  7 AM: 0.80 11 AM: 0.90 6 PM: 0.925 10 PM: 0.80 - ICR: 12, except 10 with lunch (12 pm -2:30 pm) - target: 110-120 - ISF: 60 >> 70 - Insulin on Board: 4 hours  Check sugars, enter carbs and bolus before the 3 main meals. Enter the carbs and bolus for snacks. Check sugars at bedtime.  Come back to see Bonita Quin in 2-3 weeks.  Please return in 3 months with your sugar log.   - Continue checking sugars at different times of the day - check at least 4 times a day, rotating checks.  - advised for yearly eye exams >> she is up-to-date - no signs of other autoimmune disorders - Return to clinic in 3 mo with sugar log   - time spent with the patient: 40 minutes, of which >50% was spent in reviewing her pump downloads, discussing her hypo- and  hyper-glycemic episodes, reviewing previous labs and pump settings and developing a plan to avoid hypo- and hyper-glycemia.   Carlus Pavlov, MD PhD [facility] Endocrinology

## 2016-12-12 NOTE — Patient Instructions (Addendum)
Please change the settings: - basal rates: 12 AM: 0.70 units/h  7 AM: 0.80 11 AM: 0.90 6 PM: 0.925 10 PM: 0.80 - ICR: 12, except 10 with lunch (12 pm -2:30 pm) - target: 110-120 - ISF: 60 >> 70 - Insulin on Board: 4 hours  Check sugars, enter carbs and bolus before the 3 main meals. Enter the carbs and bolus for snacks. Check sugars at bedtime.  Come back to see Brooke Shah in 2-3 weeks.  Please return in 3 months with your sugar log.

## 2017-01-01 ENCOUNTER — Encounter: Payer: 59 | Attending: Internal Medicine | Admitting: Nutrition

## 2017-01-01 DIAGNOSIS — E1065 Type 1 diabetes mellitus with hyperglycemia: Secondary | ICD-10-CM | POA: Insufficient documentation

## 2017-01-01 DIAGNOSIS — Z9641 Presence of insulin pump (external) (internal): Secondary | ICD-10-CM | POA: Insufficient documentation

## 2017-01-01 DIAGNOSIS — Z713 Dietary counseling and surveillance: Secondary | ICD-10-CM | POA: Diagnosis not present

## 2017-01-01 NOTE — Patient Instructions (Signed)
Do another 3 day food record and return in one week.

## 2017-01-01 NOTE — Progress Notes (Signed)
Patient did a 3 day food record.  She appears to be counting carbs correctly.  Lunch and supper meals are high in fat.  Pt. Is taking less insulin than what the pump says to take, for both a meal time bolus and correction bolus--she is reducing the amount by at least 1 unit.  Per Dr. Charlean SanfilippoGherghe's orders, patient's I/C ratio was changed:  MN: 12 to 14,  2:30PM: 12 to 10. And ISF changes from 70-80.   Patient is not taking extra insulin when eating high fat, nor is she extending the bolus.  We discussed how fat raises blood sugar and she is coming in next week for another review.  I will show her then how much fat she is eating, and how high her blood sugar is going as a result, and will show her how to extend the bolus for these meals. --2-3 hours.  She had no final questions.

## 2017-01-02 ENCOUNTER — Other Ambulatory Visit: Payer: Self-pay

## 2017-01-02 ENCOUNTER — Telehealth: Payer: Self-pay | Admitting: Nutrition

## 2017-01-02 MED ORDER — FREESTYLE LIBRE READER DEVI: 1.0000 | 0 refills | Status: DC

## 2017-01-02 MED ORDER — FREESTYLE LIBRE SENSOR SYSTEM MISC: 1.0000 | 6 refills | Status: DC

## 2017-01-02 NOTE — Telephone Encounter (Signed)
Pt. Told to reduce the basal rate from MN to 6AM by 0.1u/hr.  She also wants the libre CGM.  Note to [name] to send in to [facility]

## 2017-01-02 NOTE — Telephone Encounter (Signed)
Brooke Shah, may need to decrease basal rates at night in this case, by 0.1.

## 2017-01-02 NOTE — Telephone Encounter (Signed)
Pt. Reported that blood sugar at bedtime(11:30PM):  279 (with 0.4u active insulin).  Pump said to take 1.5u (Changed ISF to 80 yesterday).  At 3:30AM: 85.  At 5:30 this morning: 52 sweaty and shakey.   Lunch yesterday was chicken nachos and blood sugar was 423 acS.  Discussed with her that this meal was very high fat.  She needs to add 2units to the bolus wizard and extend it over 2 hours.  She agreed to do this.

## 2017-01-15 ENCOUNTER — Other Ambulatory Visit: Payer: Self-pay

## 2017-01-15 MED ORDER — FREESTYLE LIBRE SENSOR SYSTEM MISC: 1.0000 | 6 refills | Status: DC

## 2017-01-15 MED ORDER — FREESTYLE LIBRE READER DEVI: 1.0000 | 0 refills | Status: DC

## 2017-03-13 ENCOUNTER — Ambulatory Visit: Payer: 59 | Admitting: Internal Medicine

## 2017-03-18 ENCOUNTER — Other Ambulatory Visit: Payer: Self-pay

## 2017-03-18 MED ORDER — FREESTYLE LIBRE READER DEVI: 1.0000 | 0 refills | Status: DC

## 2017-03-18 NOTE — Telephone Encounter (Signed)
Called [facility] regarding the PA for KooskiaLibre system. Spoke with a rep, resubmitted the PA for it, it was approved. Sent to the pharmacy. PA- [id number]

## 2017-03-22 ENCOUNTER — Other Ambulatory Visit: Payer: Self-pay

## 2017-03-22 ENCOUNTER — Telehealth: Payer: Self-pay | Admitting: Internal Medicine

## 2017-03-22 MED ORDER — FREESTYLE LIBRE SENSOR SYSTEM MISC: 3 refills | Status: DC

## 2017-03-22 MED ORDER — FREESTYLE LIBRE READER DEVI: 1.0000 | Freq: Every day | 0 refills | Status: DC

## 2017-03-22 NOTE — Telephone Encounter (Signed)
Called [facility] and advised of the correct directions for the YonahLibre. Pharmacist understood, no other questions.

## 2017-03-22 NOTE — Telephone Encounter (Addendum)
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   [facility] [id number] [facility]   Suzan Slickshley N [phone]

## 2017-06-26 ENCOUNTER — Encounter: Payer: Self-pay | Admitting: Internal Medicine

## 2017-06-26 ENCOUNTER — Ambulatory Visit (INDEPENDENT_AMBULATORY_CARE_PROVIDER_SITE_OTHER): Payer: 59 | Admitting: Internal Medicine

## 2017-06-26 VITALS — BP 118/70 | HR 87 | Ht 62.0 in | Wt 151.0 lb

## 2017-06-26 DIAGNOSIS — E1065 Type 1 diabetes mellitus with hyperglycemia: Secondary | ICD-10-CM

## 2017-06-26 LAB — POCT GLYCOSYLATED HEMOGLOBIN (HGB A1C): HEMOGLOBIN A1C: 8.4

## 2017-06-26 NOTE — Progress Notes (Signed)
Patient ID: Brooke BecketDeborah Saad, female   DOB: April 27, 1966, 51 y.o.   MRN: [id number]  HPI: Brooke Shah is a 51 y.o.-year-old female, initially referred by her PCP, Blair HeysEhinger, Robert, MD, for management of DM1, dx 2004, uncontrolled, without complications. Last visit 6.5 mo ago.  Since last visit, she had a lot of stress with her daughter was diagnosed with bipolar disorder.  Patient started on insulin pump in 2006. Current pump: since 10/16/2013: Revel 523, initially with a Dexcom CGM, then switched to [employer] CompanyFreeStyle libre. She currently has this on at home, but she noticed that the sensor was coming off after the first 3 days so she is now taking a break from it. She uses Humalog in the pump.  Last hemoglobin A1c was: 06/24/2017: HbA1c 8.9%  Lab Results  Component Value Date   HGBA1C 8.1 12/12/2016   HGBA1C 8.3 07/19/2016   HGBA1C 8.6 05/31/2016  05/31/2016: HbA1c 8.6% 01/11/2016: HbA1c 8.7% 09/29/2015: HbA1c 8.9% 09/28/2014: HbA1c 9.8%  Pump settings: - basal rates: 12 AM: 0.6 units/h  6 AM: 0.7 11 AM: 0.9 6 PM: 0.925 10 PM: 0.80 - ICR: 12 am: 15 12 pm: 12 2:30 pm: 12 - target: 110-120 - ISF: 80 - Insulin on Board: 4 hours  TDD from basal insulin: 47% (17.4 units) >> 60% >> 56% TDD from bolus insulin: 53% (19.8 units) >> 40% >> 44% - extended bolusing: not using - changes infusion site: q 4 days now - Meter: One Touch Ultra link  Pt checks her sugars 2.7x a day: 238 +/- 86 >> 192 +/- 108 >> 178 +/- 80 (will scan reports): - am: 132, 161-315 >> 139-284 >> 90-275, 371 >> 67-285 - 2h after b'fast: 115-198 >> 74 x1 >> n/c - snacks frequently >> 105-239 - before lunch: Usually not checking >> 118-388 >> 61, 218-267, 339 >> 51, 140, 255 - 2h after lunch: 215-265 >> 136-309 >> 159-277 >> 388 - before dinner: 69, 127, 236-361 >> 71, 118-332 >> 50, 74-305, 373 >> 69-281, 325 - 2h after dinner: 111, 362, 383 >> 125-361 >> 55, 164->400 >> 163-260 - bedtime: 108, 148- 359 >> see  above  - nighttime: n/c >> 37, 60-390 >> 72, 111 No lows. Lowest sugar was 33 >> 50s >> 70s >> 37-after correction of a high CBG >> 51 ; she has hypoglycemia awareness at 70. No previous hypoglycemia admission.  Highest sugar was 300s >> >400 >> 300s. No previous DKA admissions.    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  - No CKD, last BUN/creatinine:  06/24/2017: 12/0.62, GFR 92, glucose 138. 05/31/2016: 13/0.67, GFR 94. Normal AST and ALT. Glucose 130, ACR unable to calculate (microalbumin <0.7 mg/dL) 21/30/865709/04/2015: 84/6.9611/0.68, normal LFTs Lab Results  Component Value Date   BUN 13 05/31/2016   BUN 16 01/13/2010   CREATININE 0.7 05/31/2016   CREATININE 0.9 01/13/2010  - last set of lipids: 06/26/2017: 189/69/69/107 05/31/2016: 189/68/67/108. 10/04/2015: 195/86/76/102 Lab Results  Component Value Date   CHOL 189 05/31/2016   HDL 3 (A) 05/31/2016   TRIG 68 05/31/2016   - last eye exam was on 11/2016 >> nO DR - she has numbness and tingling in her feet. B12 level 648 07/19/2015.  Last TSH: 10/04/2015: TSH 0.780, free T4 0.90 No results found for: TSH   ROS: Constitutional: no weight gain/no weight loss, no fatigue, no subjective hyperthermia, no subjective hypothermia Eyes: no blurry vision, no xerophthalmia ENT: no sore throat, no nodules palpated in throat,  no dysphagia, no odynophagia, no hoarseness Cardiovascular: no CP/no SOB/no palpitations/no leg swelling Respiratory: no cough/no SOB/no wheezing Gastrointestinal: no N/no V/no D/no C/no acid reflux Musculoskeletal: no muscle aches/no joint aches Skin: no rashes, no hair loss Neurological: no tremors/no dizziness  I reviewed pt's medications, allergies, PMH, social hx, family hx, and changes were documented in the history of present illness. Otherwise, unchanged from my initial visit note.  PMH: - DM1 - HL - depression   No past surgical history on file.   Social History   Social History  .  Marital status: Married    Spouse name: N/A  . children: yes   Occupational History  . Flight attendant   Social History Main Topics  . Smoking status: Never Smoker  . Smokeless tobacco: Not on file  . Alcohol use No  . Drug use: No   Current Outpatient Prescriptions on File Prior to Visit  Medication Sig Dispense Refill  . ALPRAZolam (XANAX) 0.5 MG tablet     . atorvastatin (LIPITOR) 10 MG tablet Take one tablet daily. 90 tablet 3  . Continuous Blood Gluc Receiver (FREESTYLE LIBRE READER) DEVI 1 Device by Other route daily. 1 Device 0  . Continuous Blood Gluc Sensor (FREESTYLE LIBRE SENSOR SYSTEM) MISC Use 1 sensor every 10 days. 4 each 3  . glucagon (GLUCAGON EMERGENCY) 1 MG injection Inject 1 mg into the muscle once as needed. 2 each 12  . glucose blood (ONE TOUCH ULTRA TEST) test strip Use as instructed to check sugar 3 times daily. 300 each 3  . insulin lispro (HUMALOG) 100 UNIT/ML injection Inject subcutaneously 100  units (1ml) daily (load  pump with 300 units every 3 days)    . magnesium oxide (MAG-OX) 400 MG tablet Take 400 mg by mouth daily.    . Nutritional Supplements (JUICE PLUS FIBRE PO) Take by mouth.    Letta Pate DELICA LANCETS FINE MISC Used to check sugar 3 times daily. 300 each 3  . sertraline (ZOLOFT) 100 MG tablet     . valACYclovir (VALTREX) 1000 MG tablet      No current facility-administered medications on file prior to visit.    No Known Allergies   FH: - see HPI  PE: BP 118/70 (BP Location: Left Arm, Patient Position: Sitting)   Pulse 87   Ht 5\' 2"  (1.575 m)   Wt 151 lb (68.5 kg)   SpO2 97%   BMI 27.62 kg/m  Wt Readings from Last 3 Encounters:  06/26/17 151 lb (68.5 kg)  12/12/16 150 lb (68 kg)  09/14/16 153 lb (69.4 kg)   Constitutional: slightly overweight, in NAD Eyes: PERRLA, EOMI, no exophthalmos ENT: moist mucous membranes, no thyromegaly, no cervical lymphadenopathy Cardiovascular: RRR, No MRG Respiratory: CTA B Gastrointestinal:  abdomen soft, NT, ND, BS+ Musculoskeletal: no deformities, strength intact in all 4 Skin: moist, warm, no rashes Neurological: no tremor with outstretched hands, DTR normal in all 4  ASSESSMENT: 1. DM1, uncontrolled, without Long-term  Complications, but with hyper and hypoglycemia  PLAN:  1. Patient with long-standing, uncontrolled type 1 diabetes, on insulin pump therapy, Returning after a long absence. She feels that her sugars are doing better, and indeed, per review of the pump downloads, her 14 day average has improved. She still has fluctuations in her blood sugars and has occasional hypoglycemia and definitely episodes of hyperglycemia. Her sugars are higher in the morning however, there is a pattern of hyperglycemia only in alternating days. It is not clear why  this happens. She usually snacks during the day and enters the carbs into the pump, however, she does not eat and her blood sugar at that time. We discussed to check her sugars with her meter and enter the carbs before meals, however, for snacks, she may use the sugar values given by the FreeStyle libre, without rechecking with her meter. We discussed to try to secure the CGM Sensor in place with Tegaderm.  - Otherwise, she is doing a better job changing her infusion sites every 4 days - She is still not entering quite all the carbs that she eats in the pump, but she is doing a better job with this - She is also bolusing with most of her meals now - She is a flight attendant and has fluctuating mealtimes so this is definitely a hurdle in her diabetes control - For now, We discussed about changes to her insulin regimen, as follows:  Patient Instructions  Please change the settings: - basal rates: 12 AM: 0.6 units/h  6 AM: 0.7 11 AM: 0.9 6 PM: 0.925 10 PM: 0.80 - ICR: 12 am: 15 12 pm: 12 2:30 pm: 12 - target: 110-120 - ISF: 80 - Insulin on Board: 4 hours  Before the main meals, check sugars with your glucometer and enter  them along with carbs in the pump.  Before the snacks, check the CGM and enter the value in the pump along with the carbs.  Please return in 1.5 months with your sugar log.    - today, HbA1c is 8.4% she wanted to have this repeated today, since she believed that the HbA1c obtained at the time of her PCPs visit was higher than expected - continue checking sugars at different times of the day - check 3x a day, rotating checks >> Restart using the CGM - advised for yearly eye exams >> she is UTD - Return to clinic in 3 mo with sugar log   - time spent with the patient: 40 min, of which >50% was spent in reviewing her pump and CGM downloads, discussing her hypo- and hyper-glycemic episodes, reviewing previous labs and pump settings and developing a plan to avoid hypo- and hyper-glycemia.    Carlus Pavlov, MD PhD [facility] Endocrinology

## 2017-06-26 NOTE — Patient Instructions (Addendum)
Please change the settings: - basal rates: 12 AM: 0.6 units/h  6 AM: 0.7 11 AM: 0.9 6 PM: 0.925 10 PM: 0.80 - ICR: 12 am: 15 12 pm: 12 2:30 pm: 12 - target: 110-120 - ISF: 80 - Insulin on Board: 4 hours  Before the main meals, check sugars with your glucometer and enter them along with carbs in the pump.  Before the snacks, check the CGM and enter the value in the pump along with the carbs.  Please return in 1.5 months with your sugar log.

## 2017-07-03 ENCOUNTER — Telehealth: Payer: Self-pay | Admitting: Internal Medicine

## 2017-07-03 NOTE — Telephone Encounter (Signed)
Pt needs refills on Humalog u 100 vial use 100 u daily load pump with 300 u every 3 days please call into [facility]

## 2017-07-04 ENCOUNTER — Other Ambulatory Visit: Payer: Self-pay

## 2017-07-04 MED ORDER — INSULIN LISPRO 100 UNIT/ML ~~LOC~~ SOLN: SUBCUTANEOUS | 3 refills | Status: DC

## 2017-07-04 NOTE — Telephone Encounter (Signed)
Submitted into pharmacy.  

## 2017-07-26 ENCOUNTER — Other Ambulatory Visit: Payer: Self-pay | Admitting: Internal Medicine

## 2017-08-21 ENCOUNTER — Ambulatory Visit: Payer: 59 | Admitting: Internal Medicine

## 2017-08-26 ENCOUNTER — Ambulatory Visit (INDEPENDENT_AMBULATORY_CARE_PROVIDER_SITE_OTHER): Payer: Self-pay | Admitting: Internal Medicine

## 2017-08-26 ENCOUNTER — Encounter: Payer: Self-pay | Admitting: Internal Medicine

## 2017-08-26 VITALS — BP 114/70 | HR 90 | Temp 98.0°F | Wt 147.5 lb

## 2017-08-26 DIAGNOSIS — E1065 Type 1 diabetes mellitus with hyperglycemia: Secondary | ICD-10-CM

## 2017-08-26 MED ORDER — ATORVASTATIN CALCIUM 10 MG PO TABS: ORAL_TABLET | ORAL | 3 refills | Status: DC

## 2017-08-26 MED ORDER — GLUCAGON (RDNA) 1 MG IJ KIT: 1.0000 mg | PACK | Freq: Once | INTRAMUSCULAR | 12 refills | Status: DC | PRN

## 2017-08-26 NOTE — Progress Notes (Signed)
Patient ID: Roxsana Riding, female   DOB: May 14, 1966, 51 y.o.   MRN: [id number]  HPI: Sanyla Summey is a 51 y.o.-year-old female, initially referred by her PCP, Blair Heys, MD, for management of DM1, dx 2004, uncontrolled, without complications. Last visit 2 mo ago.  Patient started on insulin pump in 2006. Since 10/16/2013: Revel 523 >> just started 530G 2 days ago (in warranty until 10/2017 as her old pump broke).  She initially had a Dexcom CGM, then switched to FreeStyle Libre CGM - not having this attached as her received in not charged.  She uses Humalog in the pump.  Last hemoglobin A1c was: Lab Results  Component Value Date   HGBA1C 8.4 06/26/2017   HGBA1C 8.1 12/12/2016   HGBA1C 8.3 07/19/2016  06/24/2017: HbA1c 8.9%  05/31/2016: HbA1c 8.6% 01/11/2016: HbA1c 8.7% 09/29/2015: HbA1c 8.9% 09/28/2014: HbA1c 9.8%  Pump settings: - basal rates: 12 AM: 0.6 units/h  6 AM: 0.7 11 AM: 0.9 6 PM: 0.925 10 PM: 0.80 - ICR: 12 am: 15 12 pm: 12 2:30 pm: 12 - target: 110-120 - ISF: 80 - Insulin on Board: 4 hours  TDD from basal insulin: 47% (17.4 units) >> 60% >> 56% >> 53% TDD from bolus insulin: 53% (19.8 units) >> 40% >> 44% >> 47% - extended bolusing: not using - changes infusion site: q 4 days  - Meter: One Touch Ultra link  Pt checks her sugars 2x a day (but more with CGM): 238 +/- 86 >> 192 +/- 108 >> 178 +/- 80 >> 237 +/- 110 (will scan reports): - am: 139-284 >> 90-275, 371 >> 67-285 >> 136-334 - 2h after b'fast:  n/c - snacks frequently >> 105-239 >> 246-276 - before lunch: 61, 218-267, 339 >> 51, 140, 255 >> 165-336 - 2h after lunch: 215-265 >> 136-309 >> 159-277 >> 388 >> n/c - before dinner: 50, 74-305, 373 >> 69-281, 325>> 150-352 - 2h after dinner:  55, 164->400 >> 163-260 >> 190-357 - bedtime: 108, 148- 359 >> see above  - nighttime: n/c >> 37, 60-390 >> 72, 111 >> 124-423  Lowest sugar was 33 >> 50s >> 70s >> 37-after correction of a high CBG >>  51 >> 50s - not recently; she has hypoglycemia awareness at 70. No previous hypoglycemia admission.  Highest sugar was 300s >> 600 (stress) x1. No previous DKA admissions.    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  - No CKD, last BUN/creatinine:  06/24/2017: 12/0.62, GFR 92, glucose 138. 05/31/2016: 13/0.67, GFR 94. Normal AST and ALT. Glucose 130, ACR unable to calculate (microalbumin <0.7 mg/dL) 95/62/1308: 65/7.84, normal LFTs Lab Results  Component Value Date   BUN 13 05/31/2016   BUN 16 01/13/2010   CREATININE 0.7 05/31/2016   CREATININE 0.9 01/13/2010   - + mild HL; last set of lipids: 06/26/2017: 189/69/69/107 05/31/2016: 189/68/67/108. 10/04/2015: 195/86/76/102 Lab Results  Component Value Date   CHOL 189 05/31/2016   HDL 3 (A) 05/31/2016   TRIG 68 05/31/2016   - last eye exam was on 11/2016 >> no DR - she has numbness and tingling in her feet. B12 level 648 07/19/2015.  Last TSH: 10/04/2015: TSH 0.780, free T4 0.90 No results found for: TSH  ROS: Constitutional: no weight gain/no weight loss, no fatigue, + subjective hyperthermia, no subjective hypothermia Eyes: no blurry vision, no xerophthalmia ENT: no sore throat, no nodules palpated in throat, no dysphagia, no odynophagia, no hoarseness Cardiovascular: no CP/no SOB/no palpitations/no leg swelling  Respiratory: no cough/no SOB/no wheezing Gastrointestinal: no N/no V/no D/no C/no acid reflux Musculoskeletal: no muscle aches/no joint aches Skin: no rashes, no hair loss Neurological: no tremors/no numbness/no tingling/no dizziness  I reviewed pt's medications, allergies, PMH, social hx, family hx, and changes were documented in the history of present illness. Otherwise, unchanged from my initial visit note.  PMH: - DM1 - HL - depression   No past surgical history on file.   Social History   Social History  . Marital status: Married    Spouse name: N/A  . children: yes   Occupational  History  . Flight attendant   Social History Main Topics  . Smoking status: Never Smoker  . Smokeless tobacco: Not on file  . Alcohol use No  . Drug use: No   Current Outpatient Prescriptions on File Prior to Visit  Medication Sig Dispense Refill  . ALPRAZolam (XANAX) 0.5 MG tablet     . atorvastatin (LIPITOR) 10 MG tablet Take one tablet daily. 90 tablet 3  . Continuous Blood Gluc Receiver (FREESTYLE LIBRE READER) DEVI 1 Device by Other route daily. 1 Device 0  . Continuous Blood Gluc Sensor (FREESTYLE LIBRE SENSOR SYSTEM) MISC Use 1 sensor every 10 days. 4 each 3  . glucagon (GLUCAGON EMERGENCY) 1 MG injection Inject 1 mg into the muscle once as needed. 2 each 12  . glucose blood (ONE TOUCH ULTRA TEST) test strip Use as instructed to check sugar 3 times daily. 300 each 3  . insulin lispro (HUMALOG) 100 UNIT/ML injection Inject subcutaneously 100  units (1ml) daily (load  pump with 300 units every 3 days) 30 mL 3  . magnesium oxide (MAG-OX) 400 MG tablet Take 400 mg by mouth daily.    . Nutritional Supplements (JUICE PLUS FIBRE PO) Take by mouth.    Letta Pate DELICA LANCETS FINE MISC USE 3 TIMES DAILY 300 each 5  . OVER THE COUNTER MEDICATION     . sertraline (ZOLOFT) 100 MG tablet     . valACYclovir (VALTREX) 1000 MG tablet      No current facility-administered medications on file prior to visit.    No Known Allergies   FH: - see HPI  PE: BP 114/70   Pulse 90   Temp 98 F (36.7 C) (Oral)   Wt 147 lb 8 oz (66.9 kg)   SpO2 98%   BMI 26.98 kg/m  Wt Readings from Last 3 Encounters:  08/26/17 147 lb 8 oz (66.9 kg)  06/26/17 151 lb (68.5 kg)  12/12/16 150 lb (68 kg)   Constitutional: slightly overweight, in NAD Eyes: PERRLA, EOMI, no exophthalmos ENT: moist mucous membranes, no thyromegaly, no cervical lymphadenopathy Cardiovascular: RRR, No MRG Respiratory: CTA B Gastrointestinal: abdomen soft, NT, ND, BS+ Musculoskeletal: no deformities, strength intact in all  4 Skin: moist, warm, no rashes Neurological: no tremor with outstretched hands, DTR normal in all 4  ASSESSMENT: 1. DM1, uncontrolled, without long-term complications, but with hyper and hypoglycemia  PLAN:  1. Patient with long-standing, Uncontrolled diabetes, with even worse control in the last 2 weeks, especially after her old pump broke and she obtained a new, 530 G pump. She would like the old pump, but she was told that this is not made anymore and she will either need to stay with the 530 G pump or advance. She is very unhappy with this, since she feels that the 530 G pump is larger, falls off from her belt easy, and also the clip is  not holding it in place. - We discussed about also seeing other pumps, for example the Omnipod or the t:slim and decide which one she would want after she is aware about pros and cons of each. As of now, she is so frustrated that she is contemplating coming off the pump, which I don't feel he such a good idea. She agrees with a referral to diabetes education to discuss about various pumps. - For now, we reviewed her sugars in her meter download and also her pump and they increased throughout the day usually, therefore, I will decrease her insulin to carb ratio with breakfast and will also decrease her insulin sensitivity. She has a very erratic meal program, and also, who works schedule is very hectic. Last week, she works 6 days a week, at different times during the day or the night (flight attendant) This is greatly influencing her diabetes control.  - She is afraid that she may drop her sugars after bolusing with a meal, but we discussed about how to do this especially if she needs to add carbs after she already started to eat (in this case, do manual boluses and do not enter sugar level) - We also discussed about how to do corrections before meals (correct first, if she has to, and then do a manual boluses if the meal is close to the correction) -  we also  reviewed  insulin with snacks and when to do manual boluses for these (for example if she is less than 2 hours from the previous meals) and went to do bolus wizard boluses (for example when her CGM arrow is pointing up). - She is still not entering quite all the carbs that she eats in the pump - discussed when it is very important to do so (with main meals)  - reviewed recent  labs from PCP - no TSH  available in last year, we will need to check this at next visit. Refilled her Lipitor.  - We made the following changes in her pump (I had to introduce the changes as she did not know how to do this): Patient Instructions  Please chnage the settings: - basal rates: 12 AM: 0.6 units/h  6 AM: 0.7 11 AM: 0.9 6 PM: 0.925 10 PM: 0.80 - ICR: 12 am: 15 >> 14 12 pm: 12 2:30 pm: 12 - target: 110-120 - ISF: 80 >> 70 - Insulin on Board: 4 hours  Before the main meals, check sugars with your glucometer and enter them along with carbs in the pump.   Before the snacks, check the CGM and enter the value in the pump along with the carbs.  Schedule a new appt with Bonita Quin for a discussion about other pumps.  Please return in 1.5 months with your sugar log.   - reviewed last HbA1c: 8.4% (still high) - continue checking sugars at different times of the day - check 4x a day, rotating checks - advised for yearly eye exams >> she is UTD - refused flu shot today  - Return to clinic in 1.5 mo with sugar log   - time spent with the patient: 40 min, of which >50% was spent in reviewing her pump and meter downloads, discussing her hyper-glycemic episodes, reviewing previous labs and pump settings and developing a plan to avoid hypo- and hyper-glycemia.    Carlus Pavlov, MD PhD [facility] Endocrinology

## 2017-08-26 NOTE — Patient Instructions (Addendum)
Please chnage the settings: - basal rates: 12 AM: 0.6 units/h  6 AM: 0.7 11 AM: 0.9 6 PM: 0.925 10 PM: 0.80 - ICR: 12 am: 15 >> 14 12 pm: 12 2:30 pm: 12 - target: 110-120 - ISF: 80 >> 70 - Insulin on Board: 4 hours  Before the main meals, check sugars with your glucometer and enter them along with carbs in the pump.   Before the snacks, check the CGM and enter the value in the pump along with the carbs.  Schedule a new appt with Bonita Quin for a discussion about other pumps.  Please return in 1.5 months with your sugar log.

## 2017-09-05 ENCOUNTER — Other Ambulatory Visit: Payer: Self-pay | Admitting: Internal Medicine

## 2017-09-17 ENCOUNTER — Encounter: Payer: 59 | Attending: Internal Medicine | Admitting: Nutrition

## 2017-09-17 ENCOUNTER — Other Ambulatory Visit: Payer: Self-pay

## 2017-09-17 DIAGNOSIS — E1065 Type 1 diabetes mellitus with hyperglycemia: Secondary | ICD-10-CM

## 2017-09-17 MED ORDER — FREESTYLE LIBRE 14 DAY SENSOR MISC: 1.0000 | 3 refills | Status: DC

## 2017-09-17 NOTE — Patient Instructions (Signed)
Review the literature on the new pump and read the manual before coming in for training.

## 2017-09-17 NOTE — Progress Notes (Signed)
Brooke Shah is "not happy" with her pump update to the Paradigm pump.  She does not like the size and wants to go back on injections.  She was shown the Tandem pump and the OmniPod pump and she decided to try to OmniPod.  Paperwork was filled out and faxed in.  She will call me when she gets the pump in the mail, for training.   Says she is pay attention to her Josephine IgoLibre and is able to take more insulin when she sees the blood sugars going very high and treat the lows before they happen.  She was shown how to figure the carbs needed when treating a low when she has IOB.  She reported good understanding of this and had no final questions.   We discussed the new Josephine IgoLibre, and she wants to new one.  [name] ordered this.

## 2017-09-26 ENCOUNTER — Other Ambulatory Visit: Payer: Self-pay | Admitting: Obstetrics & Gynecology

## 2017-09-26 DIAGNOSIS — R928 Other abnormal and inconclusive findings on diagnostic imaging of breast: Secondary | ICD-10-CM

## 2017-09-30 ENCOUNTER — Ambulatory Visit
Admission: RE | Admit: 2017-09-30 | Discharge: 2017-09-30 | Disposition: A | Discharge status: Home / Self Care | Payer: 59 | Source: Ambulatory Visit | Attending: Obstetrics & Gynecology | Admitting: Obstetrics & Gynecology

## 2017-09-30 DIAGNOSIS — R928 Other abnormal and inconclusive findings on diagnostic imaging of breast: Secondary | ICD-10-CM

## 2017-10-18 ENCOUNTER — Ambulatory Visit: Payer: Self-pay | Admitting: Internal Medicine

## 2017-10-23 ENCOUNTER — Telehealth: Payer: Self-pay

## 2017-10-23 NOTE — Telephone Encounter (Signed)
Called patient to discuss what infusion set she has, no answer LMTCB

## 2017-10-23 NOTE — Telephone Encounter (Signed)
Called patient and told her that we have nothing from [employer]. Encouraged her to call [employer] ASAP.  I also told her that I would leave 3 infusion sets up front for her until her order comes in.

## 2017-10-23 NOTE — Telephone Encounter (Signed)
Pt returned call pt request a call about her diabetic supplies.

## 2017-10-23 NOTE — Telephone Encounter (Signed)
[phone] 

## 2017-10-23 NOTE — Telephone Encounter (Signed)
Do you have any samples to set aside for this patient just incase she runs out before the supplies and such arrive? Still pending fax from [employer]

## 2017-10-23 NOTE — Telephone Encounter (Signed)
Pt called back She needs the medtronic neo infusion set pink and 18 inch tubing and the reservoirs are the 1.85 3 boxes of each

## 2017-10-31 ENCOUNTER — Telehealth: Payer: Self-pay | Admitting: Internal Medicine

## 2017-10-31 ENCOUNTER — Telehealth: Payer: Self-pay | Admitting: Dietician

## 2017-10-31 ENCOUNTER — Encounter: Payer: Self-pay | Admitting: Internal Medicine

## 2017-10-31 NOTE — Telephone Encounter (Signed)
error 

## 2017-10-31 NOTE — Telephone Encounter (Signed)
Please advise on pts switch to omni pod, I have no documentation about this at my desk or in pts chart.

## 2017-10-31 NOTE — Telephone Encounter (Signed)
Pt had been denied for her omni pod. Said Dr was suppose to have a Per to per inview with insurances today at 12:30   Calling to inquire about if she was approved or not  [phone]

## 2017-12-19 ENCOUNTER — Ambulatory Visit (INDEPENDENT_AMBULATORY_CARE_PROVIDER_SITE_OTHER): Payer: 59 | Admitting: Internal Medicine

## 2017-12-19 ENCOUNTER — Encounter: Payer: Self-pay | Admitting: Internal Medicine

## 2017-12-19 VITALS — BP 118/62 | HR 94 | Temp 98.0°F | Ht 62.0 in | Wt 147.8 lb

## 2017-12-19 DIAGNOSIS — E1065 Type 1 diabetes mellitus with hyperglycemia: Secondary | ICD-10-CM | POA: Diagnosis not present

## 2017-12-19 LAB — POCT GLYCOSYLATED HEMOGLOBIN (HGB A1C): HEMOGLOBIN A1C: 8.5

## 2017-12-19 NOTE — Progress Notes (Signed)
Patient ID: Brooke Shah, female   DOB: 1966/01/23, 52 y.o.   MRN: [id number]  HPI: Brooke Shah is a 52 y.o.-year-old female, returning for follow-up for DM1, dx 2004, uncontrolled, without complications. Last visit 4 mo ago.  She started Weight watchers 2 days ago.  Sugars already started to improve.  Lowest sugar since then: 80s last night (took 2 glucose tablets), and 70s this morning.  Patient started on insulin pump in 2006. She had a Revel 523 Medtronic pump since 03/2013. She started the 530 G Medtronic since 08/2017 (in warranty until 10/2017 as her old pump broke)...  She did not like the pump and we tried to change to an Omnipod but this was not approved even after a peer to peer review.  She initially had a Dexcom CGM, then switched to FreeStyle Libre CGM 14 days >> we reviewed the traces on her phone today.  She has Humalog in her pump.  Last hemoglobin A1c was: Lab Results  Component Value Date   HGBA1C 8.4 06/26/2017   HGBA1C 8.1 12/12/2016   HGBA1C 8.3 07/19/2016  06/24/2017: HbA1c 8.9%  05/31/2016: HbA1c 8.6% 01/11/2016: HbA1c 8.7% 09/29/2015: HbA1c 8.9% 09/28/2014: HbA1c 9.8%  Pump settings: - basal rates: 12 AM: 0.6 units/h  6 AM: 0.7 11 AM: 0.9 6 PM: 0.925 10 PM: 0.80 - ICR: 12 am: 15 >> 14 12 pm: 12 2:30 pm: 12 - target: 110-120 - ISF: 80 >> 70 - Insulin on Board: 4 hours  TDD from basal insulin: 53% >> 55% (18.6 units). TDD from bolus insulin: 47% >> 45% (15.4 units) Total daily dose: up to 40 units a day - extended bolusing: not using - changes infusion site: q 3-4 days. - Meter: One Touch Ultra link  Pt checks her sugars 4.1 x a day: Average 237 +/- 110 >>  249+/-78 (will scan reports): - am:  67-285 >> 136-334 >> last night: 70, prev: 60-275 - 2h after b'fast: 105-239 >> 246-276 >> 185-283 - before lunch:  51, 140, 255 >> 165-336 >> 169-351 - 2h after lunch: 159-277 >> 388 >> n/c >> 230->400 - before dinner: 69-281, 325>> 150-352  >> 154-325 - 2h after dinner:  163-260 >> 190-357 >> 263->400 - bedtime: 108, 148- 359 >> see above  - nighttime: 72, 111 >> 124-423 >> 170-230, last night: 80  Lowest sugar was 33 >> ... >> 50s - not recently >> 70; she has hypoglycemia awareness at 70s. No previous hypoglycemia admission.  Highest sugar was 300s >> 600 (stress) x1 >> >400. No previous  DKA admissions.    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  - No CKD, last BUN/creatinine:  09/09/2017: 11/0.66, GFR 104, Glu 233, Aphos 124 (39-117) 06/24/2017: 12/0.62, GFR 92, glucose 138. 05/31/2016: 13/0.67, GFR 94. Normal AST and ALT. Glucose 130, ACR unable to calculate (microalbumin <0.7 mg/dL) 14/78/2956: 21/3.08, normal LFTs Lab Results  Component Value Date   BUN 13 05/31/2016   BUN 16 01/13/2010   CREATININE 0.7 05/31/2016   CREATININE 0.9 01/13/2010   - + mild  HL; last set of lipids: 09/09/2017: 198/104/67/110 06/26/2017: 189/69/69/107 05/31/2016: 189/68/67/108. 10/04/2015: 195/86/76/102 Lab Results  Component Value Date   CHOL 189 05/31/2016   HDL 3 (A) 05/31/2016   TRIG 68 05/31/2016   - last eye exam was on 11/2016 >> No DR. - + numbness and tingling in her feet. B12 level 648 07/19/2015.  Last TSH: 09/09/2017: TSH 1.18 10/04/2015: TSH 0.780, free T4 0.90  No results found for: TSH  ROS: Constitutional: no weight gain/no weight loss, no fatigue, no subjective hyperthermia, no subjective hypothermia Eyes: no blurry vision, no xerophthalmia ENT: no sore throat, no nodules palpated in throat, no dysphagia, no odynophagia, no hoarseness Cardiovascular: no CP/no SOB/no palpitations/no leg swelling Respiratory: no cough/no SOB/no wheezing Gastrointestinal: no N/no V/no D/no C/no acid reflux Musculoskeletal: no muscle aches/no joint aches Skin: no rashes, no hair loss Neurological: no tremors/no numbness/no tingling/no dizziness  I reviewed pt's medications, allergies, PMH, social hx, family hx,  and changes were documented in the history of present illness. Otherwise, unchanged from my initial visit note.  PMH: - DM1 - HL - depression   No past surgical history on file.   Social History   Social History  . Marital status: Married    Spouse name: N/A  . children: yes   Occupational History  . Flight attendant   Social History Main Topics  . Smoking status: Never Smoker  . Smokeless tobacco: Not on file  . Alcohol use No  . Drug use: No   Current Outpatient Medications on File Prior to Visit  Medication Sig Dispense Refill  . ALPRAZolam (XANAX) 0.5 MG tablet     . atorvastatin (LIPITOR) 10 MG tablet Take one tablet daily. 90 tablet 3  . Continuous Blood Gluc Sensor (FREESTYLE LIBRE 14 DAY SENSOR) MISC Inject 1 Device every 14 (fourteen) days into the skin. 2 each 3  . glucagon (GLUCAGON EMERGENCY) 1 MG injection Inject 1 mg into the muscle once as needed. 1 each 12  . insulin lispro (HUMALOG) 100 UNIT/ML injection Inject subcutaneously 100  units (1ml) daily (load  pump with 300 units every 3 days) 30 mL 3  . magnesium oxide (MAG-OX) 400 MG tablet Take 400 mg by mouth daily.    . Nutritional Supplements (JUICE PLUS FIBRE PO) Take by mouth.    . ONE TOUCH ULTRA TEST test strip CHECK BLOOD SUGAR 3 TIMES  DAILY 300 each 5  . ONETOUCH DELICA LANCETS FINE MISC USE 3 TIMES DAILY 300 each 5  . OVER THE COUNTER MEDICATION     . sertraline (ZOLOFT) 100 MG tablet     . valACYclovir (VALTREX) 1000 MG tablet      No current facility-administered medications on file prior to visit.    No Known Allergies   FH: - see HPI  PE: BP 118/62 (BP Location: Left Arm, Patient Position: Sitting, Cuff Size: Normal)   Pulse 94   Temp 98 F (36.7 C) (Oral)   Ht 5\' 2"  (1.575 m)   Wt 147 lb 12.8 oz (67 kg)   SpO2 98%   BMI 27.03 kg/m  Wt Readings from Last 3 Encounters:  12/19/17 147 lb 12.8 oz (67 kg)  08/26/17 147 lb 8 oz (66.9 kg)  06/26/17 151 lb (68.5 kg)    .xtepe  ASSESSMENT: 1. DM1, uncontrolled, without long-term complications, but with hyper and hypoglycemia  PLAN:  1. Patient with long-standing, uncontrolled, diabetes, with still poor control.  She is on the 530 G pump, which is a refurbished one and should have run out of warranty in 10/2017.  She would like to get on an Omnipod, but we had a lot of problems with her insurance company approving this.  We will try again. - At this visit, sugars are still high and reviewing her carb counting, she is not doing this correctly.  She is using the my fitness pal app to calculate the carbs,  but we verified a number of carbs for her breakfast this morning with 2 other resources and the amount was actually doubled in the one that she entered.  I advised her to use calorie Brooke DareKing, which is more accurate. - She started weight watchers 2 days ago and her sugars have already improved. - Again discussed about the importance of entering 3 things in the pump before a meal: Carbs, sugar, and start the bolus of insulin. - Reviewed recent labs brought by patient, normal, with few exceptions, including a slightly high a false.  I explained that this is not unusual with diabetes, especially with a concomitant glucose of 233. - We will continue the current pump settings for now: Patient Instructions  Please continue: - basal rates: 12 AM: 0.6 units/h  6 AM: 0.7 11 AM: 0.9 6 PM: 0.925 10 PM: 0.80 - ICR: 12 am: 14 12 pm: 12 2:30 pm: 12 - target: 110-120 - ISF: 70 - Insulin on Board: 4 hours  Enter: - carbs (C) - sugar (S) - insulin (I) Before meals.  Please return in 3 months with your sugar log.   - today, HbA1c is 8.5% (slightly higher) - continue checking sugars at different times of the day - check 4x a day, rotating checks - advised for yearly eye exams >> she is not UTD - Return to clinic in 3 mo with sugar log   - time spent with the patient: 25 min, of which >50% was spent in reviewing  her pump downloads and CGM traces, discussing her hypo- and hyper-glycemic episodes, reviewing previous labs and pump settings and developing a plan to avoid hypo- and hyper-glycemia.    Carlus Pavlovristina Canden Cieslinski, MD PhD [facility]

## 2017-12-19 NOTE — Patient Instructions (Addendum)
Please continue: - basal rates: 12 AM: 0.6 units/h  6 AM: 0.7 11 AM: 0.9 6 PM: 0.925 10 PM: 0.80 - ICR: 12 am: 14 12 pm: 12 2:30 pm: 12 - target: 110-120 - ISF: 70 - Insulin on Board: 4 hours  Enter: - carbs (C) - sugar (S) - insulin (I) Before meals.  Please return in 3 months with your sugar log.

## 2018-01-01 ENCOUNTER — Other Ambulatory Visit: Payer: Self-pay

## 2018-01-01 ENCOUNTER — Telehealth: Payer: Self-pay | Admitting: Internal Medicine

## 2018-01-01 MED ORDER — FREESTYLE LIBRE 14 DAY SENSOR MISC: 1.0000 | 3 refills | Status: DC

## 2018-01-01 NOTE — Telephone Encounter (Signed)
I phoned [employer] to get the [place] Resources[employer] Libre sensors reordered, and called Bret.  Bret returned my call saying that he spoke with Gavin PoundDeborah, telling her that [place] has a 6 month waiting period before he can resubmit the application for the OmniPod.  He said he submitted the application in October, and will resubmit it in April.  He told this to Gavin Poundeborah, and she agreed with the plan.

## 2018-01-01 NOTE — Telephone Encounter (Signed)
Message left on machine that her prescription had expired, and that I called in a new script for 2Xmonth X 1 year .   Also told her that I have a call into Bret with OmniPOd fo see what the status is on her resubmission of her OmnPod pump.  No call back as yet on that.

## 2018-01-01 NOTE — Telephone Encounter (Signed)
OK. Ty.  

## 2018-01-01 NOTE — Telephone Encounter (Signed)
Pt came into office about her Freestyle Libre. She normally receives 4 when she gets these in. This time she only received one Libre.  She would like to speak to someone to see what needs to be done.   She also would like the status of her new pump she is suppose to be getting   Please advise   Continuous Blood Gluc Sensor (FREESTYLE LIBRE 14 DAY SENSOR) MISC  [facility] [place] [phone] [place] [address]

## 2018-03-18 ENCOUNTER — Ambulatory Visit (INDEPENDENT_AMBULATORY_CARE_PROVIDER_SITE_OTHER): Payer: 59 | Admitting: Internal Medicine

## 2018-03-18 VITALS — BP 122/74 | HR 97 | Ht 62.0 in | Wt 148.4 lb

## 2018-03-18 DIAGNOSIS — E1065 Type 1 diabetes mellitus with hyperglycemia: Secondary | ICD-10-CM | POA: Diagnosis not present

## 2018-03-18 LAB — POCT GLYCOSYLATED HEMOGLOBIN (HGB A1C): Hemoglobin A1C: 8.6

## 2018-03-18 MED ORDER — METFORMIN HCL 500 MG PO TABS: 500.0000 mg | ORAL_TABLET | Freq: Two times a day (BID) | ORAL | 3 refills | Status: DC

## 2018-03-18 NOTE — Progress Notes (Signed)
Patient ID: Brooke Shah, female   DOB: [birth year], 52 y.o.   MRN: [id number]  HPI: Brooke Shah is a 52 y.o.-year-old female, returning for follow-up for DM1, dx 2004, uncontrolled, without complications. Last visit 3 mo ago.  Before last visit, she started weight watchers and sugar started to improve.  She stopped since then and does not think she can continue.  Patient started on insulin pump in 2006 She had a Revel 523 Medtronic pump since 03/2013. She started the 530 G Medtronic since 08/2017 (in warranty until 10/2017 as her old pump broke)...  She did not like the pump and we tried to change to an Omnipod but this was not approved even after peer to peer review.  She is planning to apply again for an Omnipod next month.  She initially had a Dexcom CGM, now on freestyle libre CGM.  She developed an allergy to the adhesive.  She has Humalog in her pump.  Last hemoglobin A1c was: Lab Results  Component Value Date   HGBA1C 8.5 12/19/2017   HGBA1C 8.4 06/26/2017   HGBA1C 8.1 12/12/2016  06/24/2017: HbA1c 8.9%  05/31/2016: HbA1c 8.6% 01/11/2016: HbA1c 8.7% 09/29/2015: HbA1c 8.9% 09/28/2014: HbA1c 9.8%  Pump settings: - basal rates: 12 AM: 0.6 units/h  6 AM: 0.7 11 AM: 0.9 6 PM: 0.925 10 PM: 0.80 - ICR: 12 am: 14 12 pm: 12 2:30 pm: 12 - target: 110-120 - ISF: 70 - Insulin on Board: 4 hours  TDD from basal insulin: 53% >> 55% (18.6 units) >> 57% (18.7 units). TDD from bolus insulin: 47% >> 45% (15.4 units) >> 43% (14.3 units) Total daily dose: up to 40 units a day - extended bolusing: not using - changes infusion site: q 3-4 days. - Meter: One Touch Ultra link  Pt checks her sugars 4.4 x a day: Average 237 +/- 110 >>  249+/-78 >> 217+/-70 (we will scan reports) - am:  67-285 >> 136-334 >> last night: 70, prev: 60-275 >> 92-325 - 2h after b'fast: 105-239 >> 246-276 >> 185-283 >> not a set time for breakfast - before lunch:  51, 140, 255 >> 165-336 >> 169-351  >> 150- 291 - 2h after lunch: 159-277 >> 388 >> n/c >> 230->400 >> 170-286 - before dinner: 69-281, 325>> 150-352 >> 154-325 >> 161- 280, 350 - 2h after dinner:  163-260 >> 190-357 >> 263->400 >> 147-375 - bedtime: 108, 148- 359 >> see above  - nighttime: 72, 111 >> 124-423 >> 170-230, last night: 80  Lowest sugar was 33 >> ... >> 50s - not recently >> 70 >> 92; she has hypoglycemia awareness at 70s.  No previous hypoglycemia admission.  Highest sugar was 300s >> 600 (stress) x1 >> >400 >> 300s.  No previous  DKA admissions.    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  - No CKD, last BUN/creatinine:  09/09/2017: 11/0.66, GFR 104, Glu 233, Aphos 124 (39-117) 06/24/2017: 12/0.62, GFR 92, glucose 138. 05/31/2016: 13/0.67, GFR 94. Normal AST and ALT. Glucose 130, ACR unable to calculate (microalbumin <0.7 mg/dL) 46/96/2952: 84/1.32, normal LFTs Lab Results  Component Value Date   BUN 13 05/31/2016   BUN 16 01/13/2010   CREATININE 0.7 05/31/2016   CREATININE 0.9 01/13/2010   -+ HL; last set of lipids: 09/09/2017: 198/104/67/110 06/26/2017: 189/69/69/107 05/31/2016: 189/68/67/108. 10/04/2015: 195/86/76/102 Lab Results  Component Value Date   CHOL 189 05/31/2016   HDL 3 (A) 05/31/2016   TRIG 68 05/31/2016   - last  eye exam was on 2019: No DR - + numbness and tingling in her feet. B12 level 648 07/19/2015.  Last TSH normal: 09/09/2017: TSH 1.18 10/04/2015: TSH 0.780, free T4 0.90 No results found for: TSH   She is seeing a naturopath, who advised her to have her thyroid test checked.  She would want all of  her annual tests rechecked today.  ROS: Constitutional: no weight gain/no weight loss, + fatigue, no subjective hyperthermia, no subjective hypothermia Eyes: no blurry vision, no xerophthalmia ENT: no sore throat, no nodules palpated in throat, no dysphagia, no odynophagia, no hoarseness Cardiovascular: no CP/no SOB/no palpitations/no leg swelling Respiratory:  no cough/no SOB/no wheezing Gastrointestinal: no N/no V/no D/no C/+ acid reflux Musculoskeletal: no muscle aches/no joint aches Skin: no rashes, no hair loss Neurological: no tremors/+ numbness/+ tingling/no dizziness, + headache  I reviewed pt's medications, allergies, PMH, social hx, family hx, and changes were documented in the history of present illness. Otherwise, unchanged from my initial visit note.  PMH: - DM1 - HL - depression   No past surgical history on file.   Social History   Social History  . Marital status: Married    Spouse name: N/A  . children: yes   Occupational History  . Flight attendant   Social History Main Topics  . Smoking status: Never Smoker  . Smokeless tobacco: Not on file  . Alcohol use No  . Drug use: No   Current Outpatient Medications on File Prior to Visit  Medication Sig Dispense Refill  . ALPRAZolam (XANAX) 0.5 MG tablet     . atorvastatin (LIPITOR) 10 MG tablet Take one tablet daily. 90 tablet 3  . Continuous Blood Gluc Sensor (FREESTYLE LIBRE 14 DAY SENSOR) MISC Inject 1 Device into the skin every 14 (fourteen) days. 2 each 3  . glucagon (GLUCAGON EMERGENCY) 1 MG injection Inject 1 mg into the muscle once as needed. 1 each 12  . insulin lispro (HUMALOG) 100 UNIT/ML injection Inject subcutaneously 100  units (1ml) daily (load  pump with 300 units every 3 days) 30 mL 3  . magnesium oxide (MAG-OX) 400 MG tablet Take 400 mg by mouth daily.    . Nutritional Supplements (JUICE PLUS FIBRE PO) Take by mouth.    . ONE TOUCH ULTRA TEST test strip CHECK BLOOD SUGAR 3 TIMES  DAILY 300 each 5  . ONETOUCH DELICA LANCETS FINE MISC USE 3 TIMES DAILY 300 each 5  . OVER THE COUNTER MEDICATION     . sertraline (ZOLOFT) 100 MG tablet     . valACYclovir (VALTREX) 1000 MG tablet      No current facility-administered medications on file prior to visit.    No Known Allergies   FH: - see HPI  PE: BP 122/74   Pulse 97   Ht  (1.575 m)   Wt 148  lb 6.4 oz (67.3 kg)   SpO2 98%   BMI 27.14 kg/m  Wt Readings from Last 3 Encounters:  03/18/18 148 lb 6.4 oz (67.3 kg)  12/19/17 147 lb 12.8 oz (67 kg)  08/26/17 147 lb 8 oz (66.9 kg)   Constitutional: overweight, in NAD Eyes: PERRLA, EOMI, no exophthalmos ENT: moist mucous membranes, no thyromegaly, no cervical lymphadenopathy Cardiovascular:  tachycardia, RR, No MRG Respiratory: CTA B Gastrointestinal: abdomen soft, NT, ND, BS+ Musculoskeletal: no deformities, strength intact in all 4 Skin: moist, warm, no rashes Neurological: no tremor with outstretched hands, DTR normal in all 4  ASSESSMENT: 1. DM1, uncontrolled,  without long-term complications, but with hyper and hypoglycemia  PLAN:  1. Patient with long-standing, uncontrolled, type 1 diabetes, with improved control at last visit after she started weight watchers.  She is on the 530 G Medtronic pump.  She would have liked an Omnipod but we had a lot of problems with [place] [employer] not covering this. - At last visit, sugars were still high and she was not counting the carbs correctly.  She was using the my fitness pal app to calculate the carbs.  I advised her to use the calorie Brooke Dare app, which is more accurate.  I also again advised her to enter 3 things in the pump before meal: Carbs, sugars, and insulin (CSI). - At this visit, sugars are still very variable, mostly higher than normal. - Reviewing the pump downloads, her basal rates appear to be accurate, but she is not getting enough insulin with meals.  This could be from either not counting the carbs correctly (she is still struggling with this) or due to an  inadequate insulin to carb ratio.  Will decrease her ICRs at this visit.  I think she would also benefit from more insulin for correction for hyperglycemia so we will decrease her ISF  - she would also want me to give her an appetite suppressant >> I suggested metformin which can also help [place] with diabetes.  I strongly advised  her to skip the metformin if by any reason she is dehydrated.  We will start with 1 tablet of 500 mg of metformin daily and advance to twice a day.  I advised her to take it with meals. - We will check her annual labs today at her request -  I advised her to: Patient Instructions  Please change: - basal rates: 12 AM: 0.6 units/h  6 AM: 0.7 11 AM: 0.9 6 PM: 0.925 10 PM: 0.80 - ICR: 12 am: 14 >> 10 12 pm: 12 >> 10 2:30 pm: 12 >> 10 - target: 110-120 - ISF: 70 >> 60 - Insulin on Board: 4 hours  Enter: - carbs (C) - sugar (S) - insulin (I) Before meals.  Please start Metformin 500 mg with dinner x 4 days. If you tolerate this well, add another Metformin tablet (500 mg) with breakfast. Continue with 500 mg of metformin 2x a day with breakfast and dinner.  Please return in 3 months with your sugar log.    - today, HbA1c is 8.6% (slightly higher) - continue checking sugars at different times of the day - check 4x a day, rotating checks - advised for yearly eye exams >> she is UTD - Return to clinic in 3 mo with sugar log    - time spent with the patient: 40 min, of which >50% was spent in reviewing her pump and CGM downloads, discussing her hypo- and hyper-glycemic episodes, reviewing previous labs and pump settings and developing a plan to avoid hypo- and hyper-glycemia.   Component     Latest Ref Rng & Units 03/18/2018  Glucose     65 - 99 mg/dL 161 (H)  BUN     7 - 25 mg/dL 11  Creatinine     0.96 - 1.05 mg/dL 0.45  GFR, Est Non African American     > OR = 60 mL/min/1.64m2 101  GFR, Est African American     > OR = 60 mL/min/1.77m2 117  BUN/Creatinine Ratio     6 - 22 (calc) NOT APPLICABLE  Sodium  135 - 146 mmol/L 139  Potassium     3.5 - 5.3 mmol/L 4.8  Chloride     98 - 110 mmol/L 100  CO2     20 - 32 mmol/L 26  Calcium     8.6 - 10.4 mg/dL 9.3  Total Protein     6.1 - 8.1 g/dL 6.4  Albumin MSPROF     3.6 - 5.1 g/dL 4.1  Globulin     1.9 - 3.7 g/dL  (calc) 2.3  AG Ratio     1.0 - 2.5 (calc) 1.8  Total Bilirubin     0.2 - 1.2 mg/dL 0.5  Alkaline phosphatase (APISO)     33 - 130 U/L 110  AST     10 - 35 U/L 26  ALT     6 - 29 U/L 27  Cholesterol     0 - 200 mg/dL 161  Triglycerides     0.0 - 149.0 mg/dL 096.0  HDL Cholesterol     >39.00 mg/dL 45.40  VLDL     0.0 - 98.1 mg/dL 19.1  LDL (calc)     0 - 99 mg/dL 79  Total CHOL/HDL Ratio      2  NonHDL      102.55  Microalb, Ur     0.0 - 1.9 mg/dL 1.5  Creatinine,U     mg/dL 478.2  MICROALB/CREAT RATIO     0.0 - 30.0 mg/g 0.8  Hemoglobin A1C      8.6  Triiodothyronine,Free,Serum     2.3 - 4.2 pg/mL 3.1  T4,Free(Direct)     0.60 - 1.60 ng/dL 9.56  TSH     2.13 - 0.86 uIU/mL 0.99  Labs are normal, except high glucose.  Carlus Pavlov, MD PhD [facility]

## 2018-03-18 NOTE — Patient Instructions (Addendum)
Please change: - basal rates: 12 AM: 0.6 units/h  6 AM: 0.7 11 AM: 0.9 6 PM: 0.925 10 PM: 0.80 - ICR: 12 am: 14 >> 10 12 pm: 12 >> 10 2:30 pm: 12 >> 10 - target: 110-120 - ISF: 70 >> 60 - Insulin on Board: 4 hours  Enter: - carbs (C) - sugar (S) - insulin (I) Before meals.  Please start Metformin 500 mg with dinner x 4 days. If you tolerate this well, add another Metformin tablet (500 mg) with breakfast. Continue with 500 mg of metformin 2x a day with breakfast and dinner.  Please return in 3 months with your sugar log.

## 2018-03-19 ENCOUNTER — Encounter: Payer: Self-pay | Admitting: Internal Medicine

## 2018-03-19 LAB — COMPLETE METABOLIC PANEL WITH GFR
AG Ratio: 1.8 (calc) (ref 1.0–2.5)
ALBUMIN MSPROF: 4.1 g/dL (ref 3.6–5.1)
ALKALINE PHOSPHATASE (APISO): 110 U/L (ref 33–130)
ALT: 27 U/L (ref 6–29)
AST: 26 U/L (ref 10–35)
BUN: 11 mg/dL (ref 7–25)
CALCIUM: 9.3 mg/dL (ref 8.6–10.4)
CHLORIDE: 100 mmol/L (ref 98–110)
CO2: 26 mmol/L (ref 20–32)
Creat: 0.7 mg/dL (ref 0.50–1.05)
GFR, EST NON AFRICAN AMERICAN: 101 mL/min/{1.73_m2} (ref >=60)
GFR, Est African American: 117 mL/min/{1.73_m2} (ref >=60)
GLOBULIN: 2.3 g/dL (ref 1.9–3.7)
GLUCOSE: 182 mg/dL — AB (ref 65–99)
POTASSIUM: 4.8 mmol/L (ref 3.5–5.3)
Sodium: 139 mmol/L (ref 135–146)
Total Bilirubin: 0.5 mg/dL (ref 0.2–1.2)
Total Protein: 6.4 g/dL (ref 6.1–8.1)

## 2018-03-19 LAB — LIPID PANEL
CHOL/HDL RATIO: 2
CHOLESTEROL: 177 mg/dL (ref 0–200)
HDL: 74.9 mg/dL (ref >39.00)
LDL Cholesterol: 79 mg/dL (ref 0–99)
NonHDL: 102.55
TRIGLYCERIDES: 116 mg/dL (ref 0.0–149.0)
VLDL: 23.2 mg/dL (ref 0.0–40.0)

## 2018-03-19 LAB — T4, FREE: Free T4: 0.64 ng/dL (ref 0.60–1.60)

## 2018-03-19 LAB — SPECIMEN COMPROMISED

## 2018-03-19 LAB — MICROALBUMIN / CREATININE URINE RATIO
Creatinine,U: 192.8 mg/dL
Microalb Creat Ratio: 0.8 mg/g (ref 0.0–30.0)
Microalb, Ur: 1.5 mg/dL (ref 0.0–1.9)

## 2018-03-19 LAB — TSH: TSH: 0.99 u[IU]/mL (ref 0.35–4.50)

## 2018-03-19 LAB — T3, FREE: T3 FREE: 3.1 pg/mL (ref 2.3–4.2)

## 2018-03-29 ENCOUNTER — Other Ambulatory Visit: Payer: Self-pay | Admitting: Internal Medicine

## 2018-05-05 ENCOUNTER — Other Ambulatory Visit: Payer: Self-pay | Admitting: Internal Medicine

## 2018-05-19 ENCOUNTER — Other Ambulatory Visit: Payer: Self-pay | Admitting: Internal Medicine

## 2018-05-19 NOTE — Telephone Encounter (Signed)
Ok

## 2018-05-19 NOTE — Telephone Encounter (Signed)
Is this okay to refill? 

## 2018-06-11 ENCOUNTER — Telehealth: Payer: Self-pay | Admitting: Nutrition

## 2018-06-11 NOTE — Telephone Encounter (Signed)
Pt. Called yesterday.  She had not heard back from her insurance company.  I told her we got a denial, and that I have called our Libre rep. To see if we can do anything else for her.  Possiblity that we just send to the pharmacy near her.  Told her that I will contact her back once I hear from the [place] rep.--alisha.

## 2018-07-18 ENCOUNTER — Ambulatory Visit: Payer: 59 | Admitting: Internal Medicine

## 2018-07-18 DIAGNOSIS — Z0289 Encounter for other administrative examinations: Secondary | ICD-10-CM

## 2018-08-19 DIAGNOSIS — Z23 Encounter for immunization: Secondary | ICD-10-CM | POA: Diagnosis not present

## 2018-08-19 DIAGNOSIS — E109 Type 1 diabetes mellitus without complications: Secondary | ICD-10-CM | POA: Diagnosis not present

## 2018-08-19 DIAGNOSIS — Z0000 Encounter for general adult medical examination without abnormal findings: Secondary | ICD-10-CM | POA: Diagnosis not present

## 2018-09-02 ENCOUNTER — Telehealth: Payer: Self-pay | Admitting: Internal Medicine

## 2018-09-02 MED ORDER — FLUCONAZOLE 150 MG PO TABS: 150.0000 mg | ORAL_TABLET | Freq: Once | ORAL | 1 refills | Status: DC

## 2018-09-02 NOTE — Telephone Encounter (Signed)
Sorry to hear that, that is not a common SE of metformin... OK to send Diflucan and stop Metformin.

## 2018-09-02 NOTE — Telephone Encounter (Signed)
Patient states since starting metformin she has been getting a yeast infection 2-3 times a month and would like to let Dr. Elvera Lennox know she is thinking of weaning off this medication due to the side effect and not seeing an improvement in numbers.  Patient also request RX for infection. RX sent.

## 2018-09-02 NOTE — Telephone Encounter (Signed)
Patient requests to be called at ph# [phone] re: Metformin. Patient has a couple of questions about Metformin.

## 2018-09-03 ENCOUNTER — Other Ambulatory Visit: Payer: Self-pay | Admitting: Internal Medicine

## 2018-09-03 NOTE — Telephone Encounter (Signed)
Patient notified and RX was sent. 

## 2018-09-16 DIAGNOSIS — E109 Type 1 diabetes mellitus without complications: Secondary | ICD-10-CM | POA: Diagnosis not present

## 2018-09-16 DIAGNOSIS — H04123 Dry eye syndrome of bilateral lacrimal glands: Secondary | ICD-10-CM | POA: Diagnosis not present

## 2018-09-16 DIAGNOSIS — H40053 Ocular hypertension, bilateral: Secondary | ICD-10-CM | POA: Diagnosis not present

## 2018-09-23 ENCOUNTER — Other Ambulatory Visit: Payer: Self-pay | Admitting: Internal Medicine

## 2018-09-26 ENCOUNTER — Telehealth: Payer: Self-pay | Admitting: Dietician

## 2018-09-26 DIAGNOSIS — E1065 Type 1 diabetes mellitus with hyperglycemia: Secondary | ICD-10-CM

## 2018-09-26 MED ORDER — GLUCOSE BLOOD VI STRP: ORAL_STRIP | 1 refills | Status: DC

## 2018-09-26 MED ORDER — GLUCOSE BLOOD VI STRP: ORAL_STRIP | 12 refills | Status: DC

## 2018-09-26 NOTE — Telephone Encounter (Signed)
Patient called and requested strips for the One Touch Verio Flex.  [facility]  Oran ReinLaura , RD, LDN, CDE

## 2018-10-01 DIAGNOSIS — Z01419 Encounter for gynecological examination (general) (routine) without abnormal findings: Secondary | ICD-10-CM | POA: Diagnosis not present

## 2018-10-01 DIAGNOSIS — Z6826 Body mass index (BMI) 26.0-26.9, adult: Secondary | ICD-10-CM | POA: Diagnosis not present

## 2018-10-29 DIAGNOSIS — E1065 Type 1 diabetes mellitus with hyperglycemia: Secondary | ICD-10-CM | POA: Diagnosis not present

## 2018-11-03 ENCOUNTER — Telehealth: Payer: Self-pay | Admitting: Internal Medicine

## 2018-11-03 NOTE — Telephone Encounter (Signed)
[employer] is calling in regards to a PA that was submitted to them from our office, They stated that it is missing The Dx code, and the service codes. [employer] rep stated that a detailed message could be left on his voicemail with this information     [phone] Madelaine Bhatdam.

## 2018-11-04 NOTE — Telephone Encounter (Signed)
Paperwork has been reviewed, all the documentation was complete and the pump has already shipped to patient.  Per Karl BalesLinda, Dm RN, nothing else is needed.

## 2018-11-06 ENCOUNTER — Encounter: Payer: Self-pay | Admitting: Cardiology

## 2018-11-13 ENCOUNTER — Other Ambulatory Visit: Payer: Self-pay | Admitting: Internal Medicine

## 2018-11-18 ENCOUNTER — Telehealth: Payer: Self-pay | Admitting: Nutrition

## 2018-11-18 DIAGNOSIS — K635 Polyp of colon: Secondary | ICD-10-CM | POA: Diagnosis not present

## 2018-11-18 DIAGNOSIS — Z1211 Encounter for screening for malignant neoplasm of colon: Secondary | ICD-10-CM | POA: Diagnosis not present

## 2018-11-18 DIAGNOSIS — K640 First degree hemorrhoids: Secondary | ICD-10-CM | POA: Diagnosis not present

## 2018-11-18 NOTE — Telephone Encounter (Signed)
Message left on machine with times tomorrow and next week for pump training

## 2018-11-25 ENCOUNTER — Other Ambulatory Visit: Payer: Self-pay

## 2018-11-25 ENCOUNTER — Encounter: Payer: 59 | Attending: Internal Medicine | Admitting: Nutrition

## 2018-11-25 DIAGNOSIS — E1065 Type 1 diabetes mellitus with hyperglycemia: Secondary | ICD-10-CM | POA: Insufficient documentation

## 2018-11-25 MED ORDER — INSULIN LISPRO 100 UNIT/ML ~~LOC~~ SOLN
SUBCUTANEOUS | 3 refills | Status: DC
Start: 2018-11-25 — End: 2018-12-03

## 2018-11-25 MED ORDER — FREESTYLE LIBRE 14 DAY SENSOR MISC: 1.0000 | 3 refills | Status: DC

## 2018-11-25 MED ORDER — GLUCOSE BLOOD VI STRP: ORAL_STRIP | 12 refills | Status: DC

## 2018-11-26 NOTE — Progress Notes (Signed)
Patient was trained on how to use the OmniPod Dash pump.  Settings were transferred from her Paradigm to the Dash:   Basal rate:  MN: 0.6, 6AM:, 0.7, 11AM: 0.9, 6PM: 0.95, 10PM: 0.8,   I/C: 12, target: 110 with correction over 120, timing: 4 hours.   She filled a pod with Humalog insulin and re demonstrated how to give a bolus correctly X2.  We linked her meter to her pump, and Her blood sugar was 327, 2hr. PcL.  A correction bolus was given taken into account the IOB from her previous bolus.  We also discussed temp basal rates: when and how to use them, and extended boluses (which she does not do, but eats very high fat meals).  She reported good understanding of all topics--including high blood sugar protocols, and signed the checklist as understanding all topics.  She had no final questions..Marland Kitchen

## 2018-11-26 NOTE — Patient Instructions (Signed)
Fannie Knee extended bolus for all high fat meals, by extending at least 50% of the bolus over 4 hours, and adding 1-2 extra units to the bolus for the extra meal calories from fat Read over manual and resource guide

## 2018-12-02 ENCOUNTER — Telehealth: Payer: Self-pay | Admitting: Nutrition

## 2018-12-02 ENCOUNTER — Encounter: Payer: Self-pay | Admitting: Internal Medicine

## 2018-12-02 NOTE — Telephone Encounter (Signed)
Patient reported no difficulty changing pods, or giving boluses.  She also reports no difficulty wear,or sleeping with this pod.    She reports that blood sugars are doing "very well", and likes that pump very much.  She had no questions for me at this time.

## 2018-12-03 ENCOUNTER — Other Ambulatory Visit: Payer: Self-pay

## 2018-12-03 ENCOUNTER — Ambulatory Visit (INDEPENDENT_AMBULATORY_CARE_PROVIDER_SITE_OTHER): Payer: 59 | Admitting: Cardiology

## 2018-12-03 ENCOUNTER — Encounter: Payer: Self-pay | Admitting: Cardiology

## 2018-12-03 VITALS — BP 123/80 | HR 82 | Ht 62.0 in | Wt 148.0 lb

## 2018-12-03 DIAGNOSIS — E1065 Type 1 diabetes mellitus with hyperglycemia: Secondary | ICD-10-CM

## 2018-12-03 DIAGNOSIS — E7800 Pure hypercholesterolemia, unspecified: Secondary | ICD-10-CM

## 2018-12-03 MED ORDER — INSULIN LISPRO 100 UNIT/ML ~~LOC~~ SOLN: 60.0000 [IU] | Freq: Every day | SUBCUTANEOUS | 3 refills | Status: DC

## 2018-12-03 NOTE — Progress Notes (Signed)
Cardiology Office Note:    Date:  12/03/2018   ID:  Brooke BecketDeborah Mccleave, DOB [birth year], MRN [id number]  PCP:  Blair HeysEhinger, Robert, MD  Cardiologist:  Donato SchultzMark , MD  Electrophysiologist:  None   Referring MD: Blair HeysEhinger, Robert, MD     History of Present Illness:    Brooke Shah is a 53 y.o. female here for the evaluation of hyperlipidemia in the setting of diabetes at the request of Dr. Manus GunningEhinger.  In review of office note from 08/19/2018, she has been diagnosed with obsessive-compulsive disorder.  Given her current age with hyperlipidemia and type 1 diabetes she requested a referral to cardiology for screening prevention.  Her hemoglobin A1c at last check was 9.3.  Her father died at age 52 from complications of pneumonia.  He did have a carotid endarterectomy.  Her mother died at hyperlipidemia.  Lung cancer as well as COPD age 52.  She had a brother that died at age 52 from MRSA pneumonia.  Overall she has not have any chest pain, no shortness of breath.  She is not had any side effects from the atorvastatin 10 mg.  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  Been on atorvastatin for 16 years with DM.  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 Depression   . Diabetes mellitus without complication (HCC)   . Dyslipidemia   . Hyperlipidemia   . OCD (obsessive compulsive disorder)     Past Surgical History:  Procedure Laterality Date  . ENDOMETRIAL ABLATION    . TUBAL LIGATION Bilateral     Current Medications: Current Meds  Medication Sig  . ALPRAZolam (XANAX) 0.5 MG tablet Take 0.5 mg by mouth daily as needed.   Marland Kitchen. atorvastatin (LIPITOR) 10 MG tablet TAKE 1 TABLET BY MOUTH  DAILY  . Continuous Blood Gluc Receiver (FREESTYLE LIBRE READER) DEVI USE AS DIRECTED EVERY DAY  . fluconazole (DIFLUCAN) 150 MG tablet TAKE ONE TAB BY MOUTH SINGLE DOSE  . glucose  blood (CONTOUR NEXT TEST) test strip Use as instructed to check blood sugar 5 times a day  . insulin lispro (HUMALOG) 100 UNIT/ML injection Use 2 ML every 3 days via insulin pump  . magnesium oxide (MAG-OX) 400 MG tablet Take 400 mg by mouth daily.  . Nutritional Supplements (JUICE PLUS FIBRE PO) Take by mouth.  Letta Pate. ONETOUCH DELICA LANCETS FINE MISC USE 3 TIMES DAILY  . OVER THE COUNTER MEDICATION   . sertraline (ZOLOFT) 100 MG tablet   . valACYclovir (VALTREX) 1000 MG tablet      Allergies:   Patient has no known allergies.   Social History   Socioeconomic History  . Marital status: Married    Spouse name: Not on file  . Number of children: 2  . Years of education: Not on file  . Highest education level: Not on file  Occupational History  . Occupation: FLIGHT ATTENDANT  Social Needs  . Financial resource strain: Not on file  . Food insecurity:    Worry: Not on file    Inability: Not on file  . Transportation needs:    Medical: Not on file    Non-medical: Not on file  Tobacco Use  . Smoking status: Never Smoker  . Smokeless tobacco: Never Used  Substance and Sexual Activity  . Alcohol use: Yes  . Drug use: No  . Sexual activity:  Not on file  Lifestyle  . Physical activity:    Days per week: Not on file    Minutes per session: Not on file  . Stress: Not on file  Relationships  . Social connections:    Talks on phone: Not on file    Gets together: Not on file    Attends religious service: Not on file    Active member of club or organization: Not on file    Attends meetings of clubs or organizations: Not on file    Relationship status: Not on file  Other Topics Concern  . Not on file  Social History Narrative  . Not on file     Family History: The patient's family history includes COPD in her mother; Hypercholesterolemia in her mother; Lung cancer (age of onset: 4654) in her mother; Other in her brother and father; Pneumonia (age of onset: 5285) in her father.  ROS:    Please see the history of present illness.    Denies any fevers chills nausea vomiting syncope bleeding all other systems reviewed and are negative.  EKGs/Labs/Other Studies Reviewed:    The following studies were reviewed today: Prior office notes lab work EKG  EKG:  EKG is  ordered today.  The ekg ordered today demonstrates normal sinus rhythm 82 with no other abnormalities.  Personally reviewed and interpreted  Recent Labs: 03/18/2018: ALT 27; BUN 11; Creat 0.70; Potassium 4.8; Sodium 139; TSH 0.99  Recent Lipid Panel    Component Value Date/Time   CHOL 177 03/18/2018 1621   TRIG 116.0 03/18/2018 1621   HDL 74.90 03/18/2018 1621   CHOLHDL 2 03/18/2018 1621   VLDL 23.2 03/18/2018 1621   LDLCALC 79 03/18/2018 1621    Physical Exam:    VS:  BP 123/80   Pulse 82   Ht 5\' 2"  (1.575 m)   Wt 148 lb (67.1 kg)   BMI 27.07 kg/m     Wt Readings from Last 3 Encounters:  12/03/18 148 lb (67.1 kg)  03/18/18 148 lb 6.4 oz (67.3 kg)  12/19/17 147 lb 12.8 oz (67 kg)     GEN:  Well nourished, well developed in no acute distress HEENT: Normal NECK: No JVD; No carotid bruits LYMPHATICS: No lymphadenopathy CARDIAC: RRR, no murmurs, rubs, gallops RESPIRATORY:  Clear to auscultation without rales, wheezing or rhonchi  ABDOMEN: Soft, non-tender, non-distended MUSCULOSKELETAL:  No edema; No deformity  SKIN: Warm and dry NEUROLOGIC:  Alert and oriented x 3 PSYCHIATRIC:  Normal affect   ASSESSMENT:    1. Uncontrolled type 1 diabetes mellitus with hyperglycemia (HCC)   2. Pure hypercholesterolemia    PLAN:    In order of problems listed above:  Cardiac prevention strategy in the setting of type 1 diabetes and hyperlipidemia -She is taking low-dose statin 10 mg once a day.  Last LDL 79. -Given that she is asymptomatic, it would not be unreasonable to proceed with coronary calcium score for further risk ratification, especially given her type 1 diabetes history.  If her coronary  calcium score were elevated, I would then recommend high intensity statin therapy by increasing her atorvastatin to 40 mg once a day.  We would also recommend decreasing her LDL to less than 70 at that time.  We will follow-up with results of calcium score.   Medication Adjustments/Labs and Tests Ordered: Current medicines are reviewed at length with the patient today.  Concerns regarding medicines are outlined above.  Orders Placed This Encounter  Procedures  .  CT CARDIAC SCORING  . EKG 12-Lead   No orders of the defined types were placed in this encounter.   Patient Instructions  Medication Instructions:  The current medical regimen is effective;  continue present plan and medications.  Testing/Procedures: Your physician has requested that you have coronary calcium score.   This is completed by Cardiac computed tomography (CT)  Which is a painless test that uses an x-ray machine to take clear, detailed pictures of your heart. There is a $150 fee for this testing.  Follow-Up: Follow up as needed after the above testing.  Thank you for choosing [facility]!!        Signed, Donato Schultz, MD  12/03/2018 11:56 AM    [facility]

## 2018-12-03 NOTE — Patient Instructions (Signed)
Medication Instructions:  The current medical regimen is effective;  continue present plan and medications.  Testing/Procedures: Your physician has requested that you have coronary calcium score.   This is completed by Cardiac computed tomography (CT)  Which is a painless test that uses an x-ray machine to take clear, detailed pictures of your heart. There is a $150 fee for this testing.  Follow-Up: Follow up as needed after the above testing.  Thank you for choosing [facility]!!

## 2018-12-15 ENCOUNTER — Encounter: Payer: Self-pay | Admitting: Internal Medicine

## 2018-12-15 ENCOUNTER — Ambulatory Visit (INDEPENDENT_AMBULATORY_CARE_PROVIDER_SITE_OTHER): Payer: 59 | Admitting: Internal Medicine

## 2018-12-15 VITALS — BP 138/82 | HR 90 | Ht 62.0 in | Wt 147.0 lb

## 2018-12-15 DIAGNOSIS — E1065 Type 1 diabetes mellitus with hyperglycemia: Secondary | ICD-10-CM

## 2018-12-15 LAB — POCT GLYCOSYLATED HEMOGLOBIN (HGB A1C): Hemoglobin A1C: 9 % — AB (ref 4.0–5.6)

## 2018-12-15 NOTE — Patient Instructions (Addendum)
Please continue: - basal rates: 12 AM: 0.6 units/h  6 AM: 0.7 11 AM: 0.9 6 PM: 0.925 10 PM: 0.80 - ICR: 12 am: 15 >> 13 12 pm: 15 >> 13 2:30 pm: 15 >> 13 - target: 100-120 >> 110-120 - ISF: 65 >> 60 - Insulin on Board: 4 hours  Enter: - carbs (C) - sugar (S) - insulin (I) Before meals.  Please return in 3-4 months with your sugar log.

## 2018-12-15 NOTE — Progress Notes (Signed)
Patient ID: Brooke BecketDeborah Walz, female   DOB: [birth year], 53 y.o.   MRN: [id number]  HPI: Brooke Shah is a 53 y.o.-year-old female, returning for follow-up for DM1, dx 2004, uncontrolled, without complications. Last visit 7 mo ago.  She started on an insulin pump in 2006. She had a Revel 523 Medtronic pump since 03/2013. At last visit, she was on the 530 G Medtronic since 08/2017 (in warranty until 10/2017 as her old pump broke). She did not like the pump and we tried to change to an Omnipod but this was not approved even after peer to peer review.   She finally started on an Omnipod DASH in 11/25/2018. She likes this.  She initially had a Dexcom CGM, then a freestyle libre CGM.  She developed an allergy to the adhesive.  She has not been off the sensor for quite a while his insurance stopped covering it.  Several days ago I put together a letter for her insurance asking him to try to approve it for her.  She did not receive a response yet.  She has Humalog in her pump.  Last hemoglobin A1c was: Lab Results  Component Value Date   HGBA1C 8.6 03/18/2018   HGBA1C 8.5 12/19/2017   HGBA1C 8.4 06/26/2017  06/24/2017: HbA1c 8.9%  05/31/2016: HbA1c 8.6% 01/11/2016: HbA1c 8.7% 09/29/2015: HbA1c 8.9% 09/28/2014: HbA1c 9.8%  Pump settings -since last visit, she increased her insulin sensitivity factor and ICR by herself to avoid precipitous drops - basal rates: 12 AM: 0.6 units/h  6 AM: 0.7 11 AM: 0.9 6 PM: 0.95 10 PM: 0.80 - ICR: 12 am: 10 >> 12 >> 15 12 pm: 10 >> 12 >> 15 2:30 pm: 10 >> 12 >> 15 - target: 100-120 - ISF: 60 >> 65 - Insulin on Board: 4 hours TDD from basal insulin: 53% >> 55% (18.6 units) >> 57% (18.7 units) >> 58% TDD from bolus insulin: 47% >> 45% (15.4 units) >> 43% (14.3 units) >> 42% Total daily dose: up to 40 units a day - extended bolusing: not using - changes infusion site: q 3 to 4 days - Meter: One Touch Ultra link  We tried metformin at last visit  which she had an [place] infection and stopped the medication.  Pt checks her sugars 3.9 x a day: Average  217+/-70 >> 233 (we will scan the reports)   Lowest sugar was 33 >> ... 92 >> !38 -at night, but during work; she has hypoglycemia awareness in the 3670s.  No previous hypoglycemia admissions.   Highest sugar was 300s >> 501 (overcompensating for low blood sugar).  No previous DKA admissions.    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  -No CKD: Last BUN/creatinine:  Lab Results  Component Value Date   BUN 11 03/18/2018   BUN 13 05/31/2016   CREATININE 0.70 03/18/2018   CREATININE 0.7 05/31/2016  09/09/2017: 11/0.66, GFR 104, Glu 233, Aphos 124 (39-117) 06/24/2017: 12/0.62, GFR 92, glucose 138. 05/31/2016: 13/0.67, GFR 94. Normal AST and ALT. Glucose 130, ACR unable to calculate (microalbumin <0.7 mg/dL) 01/02/725309/04/2015: 66/4.4011/0.68, normal LFTs  -+ HL; last set of lipids: Lab Results  Component Value Date   CHOL 177 03/18/2018   HDL 74.90 03/18/2018   LDLCALC 79 03/18/2018   TRIG 116.0 03/18/2018   CHOLHDL 2 03/18/2018  09/09/2017: 198/104/67/110 06/26/2017: 189/69/69/107 05/31/2016: 189/68/67/108. 10/04/2015: 195/86/76/102  - last eye exam was in 2019: No DR -+ Numbness and tingling in her feet.  B12 level 648 07/19/2015.  TSH levels reviewed: Normal: Lab Results  Component Value Date   TSH 0.99 03/18/2018  09/09/2017: TSH 1.18 10/04/2015: TSH 0.780, free T4 0.90  She is seeing a naturopath, who advised her to have her thyroid test checked.  She would want all of  her annual tests rechecked today.  ROS: Constitutional: no weight gain/no weight loss, no fatigue, no subjective hyperthermia, no subjective hypothermia Eyes: no blurry vision, no xerophthalmia ENT: no sore throat, no nodules palpated in neck, no dysphagia, no odynophagia, no hoarseness Cardiovascular: no CP/no SOB/no palpitations/no leg swelling Respiratory: no cough/no SOB/no  wheezing Gastrointestinal: no N/no V/no D/no C/no acid reflux Musculoskeletal: no muscle aches/no joint aches Skin: no rashes, no hair loss Neurological: no tremors/+ numbness/+ tingling/no dizziness, + headache  I reviewed pt's medications, allergies, PMH, social hx, family hx, and changes were documented in the history of present illness. Otherwise, unchanged from my initial visit note.  PMH: - DM1 - HL - depression   Past Surgical History:  Procedure Laterality Date  . ENDOMETRIAL ABLATION    . TUBAL LIGATION Bilateral      Social History   Social History  . Marital status: Married    Spouse name: N/A  . children: yes   Occupational History  . Flight attendant   Social History Main Topics  . Smoking status: Never Smoker  . Smokeless tobacco: Not on file  . Alcohol use No  . Drug use: No   Current Outpatient Medications on File Prior to Visit  Medication Sig Dispense Refill  . ALPRAZolam (XANAX) 0.5 MG tablet Take 0.5 mg by mouth daily as needed.     Marland Kitchen atorvastatin (LIPITOR) 10 MG tablet TAKE 1 TABLET BY MOUTH  DAILY 90 tablet 3  . Continuous Blood Gluc Receiver (FREESTYLE LIBRE READER) DEVI USE AS DIRECTED EVERY DAY 1 Device 1  . fluconazole (DIFLUCAN) 150 MG tablet TAKE ONE TAB BY MOUTH SINGLE DOSE 1 tablet 1  . glucose blood (CONTOUR NEXT TEST) test strip Use as instructed to check blood sugar 5 times a day 500 each 12  . insulin lispro (HUMALOG) 100 UNIT/ML injection Inject 0.6 mLs (60 Units total) into the skin daily. Use 60 units day via insulin pump. 2 vial 3  . magnesium oxide (MAG-OX) 400 MG tablet Take 400 mg by mouth daily.    . Nutritional Supplements (JUICE PLUS FIBRE PO) Take by mouth.    Letta Pate DELICA LANCETS FINE MISC USE 3 TIMES DAILY 300 each 5  . OVER THE COUNTER MEDICATION     . sertraline (ZOLOFT) 100 MG tablet     . valACYclovir (VALTREX) 1000 MG tablet      No current facility-administered medications on file prior to visit.    No Known  Allergies   FH: - see HPI  PE: BP 138/82   Pulse 90   Ht 5\' 2"  (1.575 m) Comment: measured  Wt 147 lb (66.7 kg)   SpO2 98%   BMI 26.89 kg/m  Wt Readings from Last 3 Encounters:  12/15/18 147 lb (66.7 kg)  12/03/18 148 lb (67.1 kg)  03/18/18 148 lb 6.4 oz (67.3 kg)   Constitutional: overweight, in NAD Eyes: PERRLA, EOMI, no exophthalmos ENT: moist mucous membranes, no thyromegaly, no cervical lymphadenopathy Cardiovascular: RRR, No MRG Respiratory: CTA B Gastrointestinal: abdomen soft, NT, ND, BS+ Musculoskeletal: no deformities, strength intact in all 4 Skin: moist, warm, no rashes Neurological: no tremor with outstretched hands, DTR normal in  all 4  ASSESSMENT: 1. DM1, uncontrolled, without long-term complications, but with hyper and hypoglycemia  PLAN:  1. Patient with longstanding, uncontrolled, type 1 diabetes, with improved control after she started weight watchers in the past, however, with worsening control after she came off.  She was on the 530 G Medtronic insulin pump but she finally was able to switch to an [employer] -In the past, she was not counting her carbs correctly so I suggested to use the calorie [employer]. -At previous visits, 1 of the problems was that she was not getting enough insulin with meals.  At last visit we decreased her insulin to carb ratios and we also decreased her ISF to hopefully improve her postprandial sugars.  At last visit we also started metformin however, she could not tolerate this as she mentioned that it was giving her yeast infections. -At this visit, we discussed about the need to restart on a CGM.  We sent her to obtain the freestyle libre again, however, I do not feel that this is the best option for her.  Discussed about the advantage of getting Dexcom G6 CGM, which will alert her before she is dropping her sugars too low.  She had a very low blood sugar, at 38, in the middle of the night, while working.  She did not feel this  coming.  She agrees to think about the Dexcom CGM and I gave her a brochure about the latest device, G6. -Reviewing her pump and meter downloads, the sugars are quite variable but there is a clear trend of increasing blood sugars throughout the day.  She is not using the settings entered in the pump as she is afraid that she would drop too low.  Therefore, she increased her insulin sensitivity factor and insulin to carb ratios at last visit.  Subsequently, she is not getting enough insulin with her meals.  She is also not entering enough carbs, per my review of the downloads.  We discussed that this is important and I also strongly advised her to use the pump settings to see how she would respond to them because if she continues to change them, we will not be able to adjust them. -We discussed about doing an insulin to carb ratio validation.  I explained that she would use a meal with a set amount of carbs, bolused based on insulin to carb ratio and check her sugars 2 hours afterwards.  The postprandial glucose would not change by more than 30 points below or above the starting point. -For now, will decrease insulin to carb ratio slightly and also her sensitivity.  I also increased her low blood sugar target -I again explained the need to enter carbs, sugars, and start the bolus before every single meal. -  I advised her to: Patient Instructions  Please continue: - basal rates: 12 AM: 0.6 units/h  6 AM: 0.7 11 AM: 0.9 6 PM: 0.925 10 PM: 0.80 - ICR: 12 am: 15 >> 13 12 pm: 15 >> 13 2:30 pm: 15 >> 13 - target: 100-120 >> 110-120 - ISF: 65 >> 60 - Insulin on Board: 4 hours  Enter: - carbs (C) - sugar (S) - insulin (I) Before meals.  Please return in 3-4 months with your sugar log.   - today, HbA1c is 9.0% (higher) - continue checking sugars at different times of the day - check 4x a day, rotating checks - advised for yearly eye exams >> she is UTD - We  will check labs at next visit -  Return to clinic in 3-4 mo with sugar log   - time spent with the patient: 40 min, of which >50% was spent in reviewing her pump and meter downloads, discussing her hypo- and hyper-glycemic episodes, reviewing previous labs and pump settings and developing a plan to avoid hypo- and hyper-glycemia.   Carlus Pavlov, MD PhD [facility] Endocrinology

## 2018-12-19 ENCOUNTER — Ambulatory Visit (INDEPENDENT_AMBULATORY_CARE_PROVIDER_SITE_OTHER)
Admission: RE | Admit: 2018-12-19 | Discharge: 2018-12-19 | Disposition: A | Discharge status: Home / Self Care | Payer: Self-pay | Source: Ambulatory Visit | Attending: Cardiology | Admitting: Cardiology

## 2018-12-19 DIAGNOSIS — E1065 Type 1 diabetes mellitus with hyperglycemia: Secondary | ICD-10-CM

## 2018-12-19 DIAGNOSIS — E7800 Pure hypercholesterolemia, unspecified: Secondary | ICD-10-CM

## 2018-12-22 ENCOUNTER — Ambulatory Visit (INDEPENDENT_AMBULATORY_CARE_PROVIDER_SITE_OTHER)
Admission: RE | Admit: 2018-12-22 | Discharge: 2018-12-22 | Disposition: A | Discharge status: Home / Self Care | Payer: Self-pay | Source: Ambulatory Visit | Attending: Cardiology | Admitting: Cardiology

## 2018-12-22 DIAGNOSIS — E7800 Pure hypercholesterolemia, unspecified: Secondary | ICD-10-CM

## 2018-12-22 DIAGNOSIS — E1065 Type 1 diabetes mellitus with hyperglycemia: Secondary | ICD-10-CM

## 2019-01-30 DIAGNOSIS — E1065 Type 1 diabetes mellitus with hyperglycemia: Secondary | ICD-10-CM | POA: Diagnosis not present

## 2019-02-03 ENCOUNTER — Encounter: Payer: Self-pay | Admitting: Internal Medicine

## 2019-02-18 ENCOUNTER — Telehealth: Payer: Self-pay

## 2019-02-18 NOTE — Telephone Encounter (Signed)
Oh, finally good news for this pt!!!!

## 2019-02-18 NOTE — Telephone Encounter (Signed)
Received letter from [employer] that they are covering pump and supplies  01/28/2019 to 01/28/2020.  Letter sent for scanning.

## 2019-03-06 ENCOUNTER — Encounter: Payer: Self-pay | Admitting: Internal Medicine

## 2019-03-09 DIAGNOSIS — M546 Pain in thoracic spine: Secondary | ICD-10-CM | POA: Diagnosis not present

## 2019-03-09 DIAGNOSIS — S336XXA Sprain of sacroiliac joint, initial encounter: Secondary | ICD-10-CM | POA: Diagnosis not present

## 2019-03-09 DIAGNOSIS — S134XXA Sprain of ligaments of cervical spine, initial encounter: Secondary | ICD-10-CM | POA: Diagnosis not present

## 2019-03-10 DIAGNOSIS — S134XXA Sprain of ligaments of cervical spine, initial encounter: Secondary | ICD-10-CM | POA: Diagnosis not present

## 2019-03-10 DIAGNOSIS — S336XXA Sprain of sacroiliac joint, initial encounter: Secondary | ICD-10-CM | POA: Diagnosis not present

## 2019-03-10 DIAGNOSIS — M546 Pain in thoracic spine: Secondary | ICD-10-CM | POA: Diagnosis not present

## 2019-03-12 DIAGNOSIS — S336XXA Sprain of sacroiliac joint, initial encounter: Secondary | ICD-10-CM | POA: Diagnosis not present

## 2019-03-12 DIAGNOSIS — M546 Pain in thoracic spine: Secondary | ICD-10-CM | POA: Diagnosis not present

## 2019-03-12 DIAGNOSIS — S134XXA Sprain of ligaments of cervical spine, initial encounter: Secondary | ICD-10-CM | POA: Diagnosis not present

## 2019-03-13 DIAGNOSIS — M546 Pain in thoracic spine: Secondary | ICD-10-CM | POA: Diagnosis not present

## 2019-03-13 DIAGNOSIS — S134XXA Sprain of ligaments of cervical spine, initial encounter: Secondary | ICD-10-CM | POA: Diagnosis not present

## 2019-03-13 DIAGNOSIS — S336XXA Sprain of sacroiliac joint, initial encounter: Secondary | ICD-10-CM | POA: Diagnosis not present

## 2019-03-16 DIAGNOSIS — S134XXA Sprain of ligaments of cervical spine, initial encounter: Secondary | ICD-10-CM | POA: Diagnosis not present

## 2019-03-16 DIAGNOSIS — S336XXA Sprain of sacroiliac joint, initial encounter: Secondary | ICD-10-CM | POA: Diagnosis not present

## 2019-03-16 DIAGNOSIS — M546 Pain in thoracic spine: Secondary | ICD-10-CM | POA: Diagnosis not present

## 2019-03-17 ENCOUNTER — Telehealth: Payer: Self-pay | Admitting: Nutrition

## 2019-03-17 NOTE — Telephone Encounter (Signed)
Patient was called to let her know that we sent her a link again to set up a [employer]ook[employer] account.  She says that she has no internet right now, but will do this ASAP.  She was also given the proconnect code to do this from home if she likes.  She had no questions.

## 2019-03-19 ENCOUNTER — Ambulatory Visit (INDEPENDENT_AMBULATORY_CARE_PROVIDER_SITE_OTHER): Payer: 59 | Admitting: Internal Medicine

## 2019-03-19 ENCOUNTER — Other Ambulatory Visit: Payer: Self-pay

## 2019-03-19 ENCOUNTER — Encounter: Payer: Self-pay | Admitting: Internal Medicine

## 2019-03-19 VITALS — BP 128/88 | HR 80 | Temp 98.1°F | Ht 62.0 in | Wt 140.0 lb

## 2019-03-19 DIAGNOSIS — E1065 Type 1 diabetes mellitus with hyperglycemia: Secondary | ICD-10-CM

## 2019-03-19 DIAGNOSIS — E785 Hyperlipidemia, unspecified: Secondary | ICD-10-CM | POA: Diagnosis not present

## 2019-03-19 DIAGNOSIS — E1069 Type 1 diabetes mellitus with other specified complication: Secondary | ICD-10-CM

## 2019-03-19 LAB — COMPLETE METABOLIC PANEL WITH GFR
AG Ratio: 2.4 (calc) (ref 1.0–2.5)
ALT: 11 U/L (ref 6–29)
AST: 16 U/L (ref 10–35)
Albumin: 4.7 g/dL (ref 3.6–5.1)
Alkaline phosphatase (APISO): 146 U/L (ref 37–153)
BUN: 13 mg/dL (ref 7–25)
CO2: 34 mmol/L — ABNORMAL HIGH (ref 20–32)
Calcium: 10.3 mg/dL (ref 8.6–10.4)
Chloride: 99 mmol/L (ref 98–110)
Creat: 0.64 mg/dL (ref 0.50–1.05)
GFR, Est African American: 120 mL/min/{1.73_m2} (ref >=60)
GFR, Est Non African American: 103 mL/min/{1.73_m2} (ref >=60)
Globulin: 2 g/dL (calc) (ref 1.9–3.7)
Glucose, Bld: 112 mg/dL — ABNORMAL HIGH (ref 65–99)
Potassium: 5.2 mmol/L (ref 3.5–5.3)
Sodium: 139 mmol/L (ref 135–146)
Total Bilirubin: 0.5 mg/dL (ref 0.2–1.2)
Total Protein: 6.7 g/dL (ref 6.1–8.1)

## 2019-03-19 LAB — LIPID PANEL
Cholesterol: 259 mg/dL — ABNORMAL HIGH (ref 0–200)
HDL: 68.3 mg/dL (ref >39.00)
LDL Cholesterol: 170 mg/dL — ABNORMAL HIGH (ref 0–99)
NonHDL: 190.88
Total CHOL/HDL Ratio: 4
Triglycerides: 104 mg/dL (ref 0.0–149.0)
VLDL: 20.8 mg/dL (ref 0.0–40.0)

## 2019-03-19 LAB — MICROALBUMIN / CREATININE URINE RATIO
Creatinine,U: 54.2 mg/dL
Microalb Creat Ratio: 1.3 mg/g (ref 0.0–30.0)
Microalb, Ur: <0.7 mg/dL (ref 0.0–1.9)

## 2019-03-19 LAB — POCT GLYCOSYLATED HEMOGLOBIN (HGB A1C): Hemoglobin A1C: 8.1 % — AB (ref 4.0–5.6)

## 2019-03-19 LAB — TSH: TSH: 1.16 u[IU]/mL (ref 0.35–4.50)

## 2019-03-19 MED ORDER — DEXCOM G6 RECEIVER DEVI: 1.0000 | Freq: Once | 0 refills | Status: AC

## 2019-03-19 MED ORDER — DEXCOM G6 TRANSMITTER MISC
1.0000 | 3 refills | Status: DC
Start: 2019-03-19 — End: 2021-03-23

## 2019-03-19 MED ORDER — DEXCOM G6 SENSOR MISC: 1.0000 | 3 refills | Status: AC

## 2019-03-19 NOTE — Progress Notes (Signed)
Patient ID: Brooke Shah, female   DOB: [birth year], 53 y.o.   MRN: [id number]  HPI: Brooke Shah is a 53 y.o.-year-old female, returning for follow-up for DM1, dx 2004, uncontrolled, without complications. Last visit 3 months ago.  Since last visit, due to the coronavirus pandemic, she opted out from work for approximately 4 months.  She started to exercise by walking 4 to 10 miles a day.  Sugars improved even occasionally to the point of lows.  However, she developed hip pain and in the last 1.5 months she was not walking much.  She plans to restart.  She started to use an insulin pump in 2006. She had a Revel 523 Medtronic pump since 03/2013. At last visit, she was on the 530 G Medtronic since 08/2017 (in warranty until 10/2017 as her old pump broke). She did not like the pump and we tried to change to an Omnipod but this was not approved even after peer to peer review.   She was finally able to start an Omnipod DASH pump on 11/25/2018.  She likes this.  She initially had a Dexcom CGM, then a freestyle libre CGM.  She developed an allergy to the adhesive for the [facility] but she now thinks it could have been an allergy to her uniform.  However, her insurance stopped covering the CGM and I had to write a letter for her insurance. This did not help and she is now off the CGM.  However, she tells me that she reconsidered and she would like to start on a Dexcom G6.  She has Humalog in her pump.  Last hemoglobin A1c was: Lab Results  Component Value Date   HGBA1C 9.0 (A) 12/15/2018   HGBA1C 8.6 03/18/2018   HGBA1C 8.5 12/19/2017  06/24/2017: HbA1c 8.9%  05/31/2016: HbA1c 8.6% 01/11/2016: HbA1c 8.7% 09/29/2015: HbA1c 8.9% 09/28/2014: HbA1c 9.8%  Pump settings (changes made at last visit are shown in bold): - basal rates: 12 AM: 0.6 units/h  6 AM: 0.7 11 AM: 0.9 6 PM: 0.95 10 PM: 0.80  - ICR: 12 am: 15 >> 13 12 pm: 15 >> 13 3 pm: 15 >> 13 - target: 100-120 >> 110-120 - ISF: 65 >>  60 - Insulin on Board: 4 hours TDD from basal insulin:  57% (18.7 units) >> 58% >> 44% TDD from bolus insulin:  43% (14.3 units) >> 42% >> 56% Total daily dose: up to 40 units a day - changes infusion site: Every 3-4 days  We tried metformin in the past but developed an yeast infection and stopped.  Pt checks her sugars 2.6x a day: Average  217+/-70 >> 233 >> 237 (we will scan the reports).  Reviewed glucometer and pump downloads:  Reviewed previous  downloads:   Lowest sugar was 33 >> ... 92 >> !38 -at night, but during work >> 55; she has hypoglycemia awareness in the 81s.  No previous hypoglycemia admissions. Highest sugar was 300s >> 501 (overcompensating for low blood sugar) >> 470.  No previous DKA admissions.  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  Now she is off until the end of July.  -No CKD: Last BUN/creatinine:  Lab Results  Component Value Date   BUN 11 03/18/2018   BUN 13 05/31/2016   CREATININE 0.70 03/18/2018   CREATININE 0.7 05/31/2016  09/09/2017: 11/0.66, GFR 104, Glu 233, Aphos 124 (39-117) 06/24/2017: 12/0.62, GFR 92, glucose 138. 05/31/2016: 13/0.67, GFR 94. Normal AST and ALT. Glucose  130, ACR unable to calculate (microalbumin <0.7 mg/dL) 00/93/8182: 99/3.71, normal LFTs  -+ HL; last set of lipids: Lab Results  Component Value Date   CHOL 177 03/18/2018   HDL 74.90 03/18/2018   LDLCALC 79 03/18/2018   TRIG 116.0 03/18/2018   CHOLHDL 2 03/18/2018  09/09/2017: 198/104/67/110 06/26/2017: 189/69/69/107 05/31/2016: 189/68/67/108. 10/04/2015: 195/86/76/102 On Lipitor 10.  - last eye exam was in 2019: No DR -+ Numbness and tingling in her feet. B12 level 648 07/19/2015.  TFTs were reviewed and these were normal: Lab Results  Component Value Date   TSH 0.99 03/18/2018  09/09/2017: TSH 1.18 10/04/2015: TSH 0.780, free T4 0.90  ROS: Constitutional: no weight gain/+ weight loss, no fatigue, no subjective hyperthermia, no  subjective hypothermia Eyes: no blurry vision, no xerophthalmia ENT: no sore throat, no nodules palpated in neck, no dysphagia, no odynophagia, no hoarseness Cardiovascular: no CP/no SOB/no palpitations/no leg swelling Respiratory: no cough/no SOB/no wheezing Gastrointestinal: no N/no V/no D/no C/no acid reflux Musculoskeletal: no muscle aches/no joint aches Skin: no rashes, no hair loss Neurological: no tremors/+ numbness/+ tingling/no dizziness  I reviewed pt's medications, allergies, PMH, social hx, family hx, and changes were documented in the history of present illness. Otherwise, unchanged from my initial visit note.  PMH: - DM1 - HL - depression   Past Surgical History:  Procedure Laterality Date  . ENDOMETRIAL ABLATION    . TUBAL LIGATION Bilateral      Social History   Social History  . Marital status: Married    Spouse name: N/A  . children: yes   Occupational History  . Flight attendant   Social History Main Topics  . Smoking status: Never Smoker  . Smokeless tobacco: Not on file  . Alcohol use No  . Drug use: No   Current Outpatient Medications on File Prior to Visit  Medication Sig Dispense Refill  . ALPRAZolam (XANAX) 0.5 MG tablet Take 0.5 mg by mouth daily as needed.     Marland Kitchen atorvastatin (LIPITOR) 10 MG tablet TAKE 1 TABLET BY MOUTH  DAILY 90 tablet 3  . Continuous Blood Gluc Receiver (FREESTYLE LIBRE READER) DEVI USE AS DIRECTED EVERY DAY (Patient not taking: Reported on 12/15/2018) 1 Device 1  . fluconazole (DIFLUCAN) 150 MG tablet TAKE ONE TAB BY MOUTH SINGLE DOSE 1 tablet 1  . glucose blood (CONTOUR NEXT TEST) test strip Use as instructed to check blood sugar 5 times a day 500 each 12  . insulin lispro (HUMALOG) 100 UNIT/ML injection Inject 0.6 mLs (60 Units total) into the skin daily. Use 60 units day via insulin pump. 2 vial 3  . magnesium oxide (MAG-OX) 400 MG tablet Take 400 mg by mouth daily.    . Nutritional Supplements (JUICE PLUS FIBRE PO) Take  by mouth.    Letta Pate DELICA LANCETS FINE MISC USE 3 TIMES DAILY 300 each 5  . OVER THE COUNTER MEDICATION     . sertraline (ZOLOFT) 100 MG tablet     . valACYclovir (VALTREX) 1000 MG tablet      No current facility-administered medications on file prior to visit.    No Known Allergies   FH: - see HPI  PE: BP 128/88   Pulse 80   Temp 98.1 F (36.7 C)   Ht 5\' 2"  (1.575 m)   Wt 140 lb (63.5 kg)   SpO2 98%   BMI 25.61 kg/m  Wt Readings from Last 3 Encounters:  03/19/19 140 lb (63.5 kg)  12/15/18 147 lb (  66.7 kg)  12/03/18 148 lb (67.1 kg)   Constitutional: Normal weight, in NAD Eyes: PERRLA, EOMI, no exophthalmos ENT: moist mucous membranes, no thyromegaly, no cervical lymphadenopathy Cardiovascular: RRR, No MRG Respiratory: CTA B Gastrointestinal: abdomen soft, NT, ND, BS+ Musculoskeletal: no deformities, strength intact in all 4 Skin: moist, warm, no rashes Neurological: no tremor with outstretched hands, DTR normal in all 4  ASSESSMENT: 1. DM1, uncontrolled, without long-term complications, but with hyper and hypoglycemia  PLAN:  1. Patient with longstanding, uncontrolled, type 1 diabetes, with improved control while on weight watchers in the past, but with worsening control after she came off.  At last visit, HbA1c was higher, at 9%. -At last visit, we decrease her insulin to carb ratios and sensitivity factor as she was not getting enough insulin with meals.  I also increased her low blood sugar target at that time.  We also discussed about doing an insulin to carb ratio validation.  I explained how this is done.  We again discussed about the importance of entering sugars, carbs, and starting the boluses approximately 15 minutes before each meal. -She was previously on the Medtronic 530 G pump but was able to switch to an Omni pod DASH pump, which she likes.  However, her insurance stopped covering the [place] CGM.  She now does not have a CGM but she would like to go  ahead and get the Dexcom G6.  I sent prescriptions for this to her pharmacy. -At this visit, we reviewed her pump downloads and it appears that the sugars are slightly better.  However, they are still very fluctuating, with sugars between most of the sugars are between 100-250, though.  Her CBGs improved when she was walking and she was even having more lows, however, not anymore, since she stopped walking due to the pain.  She is planning to restart walking soon.  There is no clear pattern in her sugars however, it does appear that maybe there is more hyperglycemia after dinner and in the morning, therefore, I advised her basal rates in the evening and overnight and also decrease her insulin to carb ratio with dinner. -Reviewing her downloads, she is entering carbs in a staggering way and we discussed about trying to pull these together and cover them with insulin.  However, this is not always possible and we discussed specific situations in which she may not be able to do so. -For now, we will continue with the above changes and she will get in touch with me after she starts walking again and we can download her pump again at that time. -  I advised her to: Patient Instructions  Please continue: - basal rates: 12 AM: 0.6 units/h >> 0.7 6 AM: 0.7 11 AM: 0.9 6 PM: 0.95 10 PM: 0.80 >> 0.9 - ICR: 12 am: 13 12 pm: 13 3 pm: 13 >> 12 - target: 110-120 - ISF: 60 - Insulin on Board: 4 hours  Please enter: - carbs (C) - sugar (S) - insulin (I) Approximately 15 minutes before each meal  Please return in 3-4 months with your sugar log.   - today, HbA1c is 8.1% (lower) - continue checking sugars at different times of the day - check >4x a day, rotating checks - we will check annual labs today - advised for yearly eye exams >> she is UTD - will check annual labs today - Return to clinic in 3 mo with sugar log   - time spent with the patient:  40 min, of which >50% was spent in reviewing her  pump and meter downloads, discussing her hypo- and hyper-glycemic episodes, reviewing previous labs and pump settings and developing a plan to avoid hypo- and hyper-glycemia.   Component     Latest Ref Rng & Units 03/19/2019  Glucose     65 - 99 mg/dL 161112 (H)  BUN     7 - 25 mg/dL 13  Creatinine     0.960.50 - 1.05 mg/dL 0.450.64  GFR, Est Non African American     > OR = 60 mL/min/1.6073m2 103  GFR, Est African American     > OR = 60 mL/min/1.3773m2 120  BUN/Creatinine Ratio     6 - 22 (calc) NOT APPLICABLE  Sodium     135 - 146 mmol/L 139  Potassium     3.5 - 5.3 mmol/L 5.2  Chloride     98 - 110 mmol/L 99  CO2     20 - 32 mmol/L 34 (H)  Calcium     8.6 - 10.4 mg/dL 40.910.3  Total Protein     6.1 - 8.1 g/dL 6.7  Albumin MSPROF     3.6 - 5.1 g/dL 4.7  Globulin     1.9 - 3.7 g/dL (calc) 2.0  AG Ratio     1.0 - 2.5 (calc) 2.4  Total Bilirubin     0.2 - 1.2 mg/dL 0.5  Alkaline phosphatase (APISO)     37 - 153 U/L 146  AST     10 - 35 U/L 16  ALT     6 - 29 U/L 11  Cholesterol     0 - 200 mg/dL 811259 (H)  Triglycerides     0.0 - 149.0 mg/dL 914.7104.0  HDL Cholesterol     >39.00 mg/dL 82.9568.30  VLDL     0.0 - 62.140.0 mg/dL 30.820.8  LDL (calc)     0 - 99 mg/dL 657170 (H)  Total CHOL/HDL Ratio      4  NonHDL      190.88  Microalb, Ur     0.0 - 1.9 mg/dL <8.4<0.7  Creatinine,U     mg/dL 69.654.2  MICROALB/CREAT RATIO     0.0 - 30.0 mg/g 1.3  Hemoglobin A1C     4.0 - 5.6 % 8.1 (A)  TSH     0.35 - 4.50 uIU/mL 1.16   Labs are at goal, with the exception of a very high LDL and total cholesterol.  I will check with her if she is taking her Lipitor consistently.  Carlus Pavlovristina Abhinav Mayorquin, MD PhD [facility]

## 2019-03-19 NOTE — Patient Instructions (Addendum)
Please continue: - basal rates: 12 AM: 0.6 units/h >> 0.7 6 AM: 0.7 11 AM: 0.9 6 PM: 0.95 10 PM: 0.80 >> 0.9 - ICR: 12 am: 13 12 pm: 13 3 pm: 13 >> 12 - target: 110-120 - ISF: 60 - Insulin on Board: 4 hours  Please enter: - carbs (C) - sugar (S) - insulin (I) Approximately 15 minutes before each meal  Please return in 3-4 months with your sugar log.

## 2019-03-23 DIAGNOSIS — E1069 Type 1 diabetes mellitus with other specified complication: Secondary | ICD-10-CM | POA: Insufficient documentation

## 2019-05-07 ENCOUNTER — Other Ambulatory Visit: Payer: 59

## 2019-05-07 ENCOUNTER — Other Ambulatory Visit: Payer: Self-pay

## 2019-05-07 DIAGNOSIS — Z20822 Contact with and (suspected) exposure to covid-19: Secondary | ICD-10-CM

## 2019-05-11 LAB — NOVEL CORONAVIRUS, NAA: SARS-CoV-2, NAA: NOT DETECTED

## 2019-07-14 ENCOUNTER — Other Ambulatory Visit: Payer: Self-pay

## 2019-07-16 ENCOUNTER — Encounter: Payer: Self-pay | Admitting: Internal Medicine

## 2019-07-16 ENCOUNTER — Ambulatory Visit: Payer: 59 | Admitting: Internal Medicine

## 2019-07-16 ENCOUNTER — Other Ambulatory Visit: Payer: Self-pay

## 2019-07-16 ENCOUNTER — Ambulatory Visit (INDEPENDENT_AMBULATORY_CARE_PROVIDER_SITE_OTHER): Payer: 59 | Admitting: Internal Medicine

## 2019-07-16 VITALS — BP 120/70 | HR 67 | Ht 62.0 in | Wt 134.0 lb

## 2019-07-16 DIAGNOSIS — L709 Acne, unspecified: Secondary | ICD-10-CM | POA: Diagnosis not present

## 2019-07-16 DIAGNOSIS — E785 Hyperlipidemia, unspecified: Secondary | ICD-10-CM

## 2019-07-16 DIAGNOSIS — E1065 Type 1 diabetes mellitus with hyperglycemia: Secondary | ICD-10-CM | POA: Diagnosis not present

## 2019-07-16 DIAGNOSIS — E1069 Type 1 diabetes mellitus with other specified complication: Secondary | ICD-10-CM

## 2019-07-16 LAB — POCT GLYCOSYLATED HEMOGLOBIN (HGB A1C): Hemoglobin A1C: 8.9 % — AB (ref 4.0–5.6)

## 2019-07-16 NOTE — Addendum Note (Signed)
Addended by: Cardell Peach I on: 07/16/2019 02:24 PM   Modules accepted: Orders

## 2019-07-16 NOTE — Progress Notes (Signed)
Patient ID: Brooke Shah, female   DOB: 29-Dec-1965, 53 y.o.   MRN: [id number]  HPI: Brooke Shah is a 53 y.o.-year-old female, returning for follow-up for DM1, dx 2004, uncontrolled, without complications. Last visit 4 months ago.  Before last visit, due to the coronavirus pandemic, she opted out from work for approximately 4 months.  She started to exercise by walking 4 to 10 miles a day.  Sugars improved significantly to the point of lows.  However, she had hip pain at last visit and was not able to walk much at that time.  She was planning to restart.  She started to use an insulin pump in 2006. She had a Revel 523 Medtronic pump since 03/2013. At last visit, she was on the [place] since 08/2017 (in warranty until 10/2017 as her old pump broke). She did not like the pump and we tried to change to an Omnipod but this was not approved even after peer to peer review.   She was finally able to start on Omni pod DASH pump on 11/25/2018.  She likes this.  She initially had a Dexcom CGM, then a freestyle libre CGM.  She developed an allergy to the adhesive for the [facility] but she then sought it could have been an allergy to her uniform.  However, her insurance stopped covering the CGM and I had to write a letter for her insurance. This did not help and she was now off the CGM at last visit. However, she needs to get this from a supplier ([facility]).  She has Humalog in her pump.  Reviewed HbA1c levels: Lab Results  Component Value Date   HGBA1C 8.1 (A) 03/19/2019   HGBA1C 9.0 (A) 12/15/2018   HGBA1C 8.6 03/18/2018  06/24/2017: HbA1c 8.9%  05/31/2016: HbA1c 8.6% 01/11/2016: HbA1c 8.7% 09/29/2015: HbA1c 8.9% 09/28/2014: HbA1c 9.8%  Pump settings (changes made at last visit are shown in bold, and by her since then in blue): - basal rates: 12 AM: 0.6 units/h >> 0.7 6 AM: 0.7 11 AM: 0.9 >> 0.85 6 PM: 0.95 >> 0.85 10 PM: 0.80 >> 0.9 >> 0.75 - ICR: 12 am: 13 12 pm: 13 3 pm: 13 >> 12  >> 13 - target: 110-120 - ISF: 60 >> 55 - Insulin on Board: 4 hours  TDD from basal insulin:  57% (18.7 units) >> 58% >> 44% >> 42% TDD from bolus insulin:  43% (14.3 units) >> 42% >> 56% >> 58% Total daily dose: up to 40 units a day - changes infusion site: Every 3-4 days  She developed yeast infections on metformin in the past.  Pt checks her sugars 4x a day: Average  217+/-70 >> 233 >> 237 >> 274 (we will scan the reports).  Reviewed glucometer and pump downloads:   Previously:   Previously:   Lowest sugar was 33 >> ... 92 >> !38 -at night, but during work >> 55 >> 70s; she has hypoglycemia awareness in the 31s.  No previous hypoglycemia admissions.  She has a non-expired glucagon kit at home Highest sugar was 300s >> 501 (overcompensating for low blood sugar) >> 470 >> 400s.  No previous DKA admissions.  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  Now she is off until October.  -No CKD: Last BUN/creatinine:  Lab Results  Component Value Date   BUN 13 03/19/2019   BUN 11 03/18/2018   CREATININE 0.64 03/19/2019   CREATININE 0.70 03/18/2018  09/09/2017:  11/0.66, GFR 104, Glu 233, Aphos 124 (39-117) 06/24/2017: 12/0.62, GFR 92, glucose 138. 05/31/2016: 13/0.67, GFR 94. Normal AST and ALT. Glucose 130, ACR unable to calculate (microalbumin <0.7 mg/dL) 07/19/2015: 11/0.68, normal LFTs  -+ HL; last set of lipids: Lab Results  Component Value Date   CHOL 259 (H) 03/19/2019   HDL 68.30 03/19/2019   LDLCALC 170 (H) 03/19/2019   TRIG 104.0 03/19/2019   CHOLHDL 4 03/19/2019  09/09/2017: 198/104/67/110 06/26/2017: 189/69/69/107 05/31/2016: 189/68/67/108. 10/04/2015: 195/86/76/102 On Lipitor 10.  - last eye exam was in 2019: No DR -+ Numbness and tingling in her feet.   B12 level 648 07/19/2015.  Latest TFTs were normal: Lab Results  Component Value Date   TSH 1.16 03/19/2019  09/09/2017: TSH 1.18 10/04/2015: TSH 0.780, free T4 0.90  She  tried Spironolactone >> potassium high >> stopped.  She also has a rash on her face which she does not feel is related to the facial mask.  She was told that this is related to hormones.  ROS: Constitutional: no weight gain/+ weight loss, no fatigue, no subjective hyperthermia, no subjective hypothermia Eyes: no blurry vision, no xerophthalmia ENT: no sore throat, no nodules palpated in neck, no dysphagia, no odynophagia, no hoarseness Cardiovascular: no CP/no SOB/no palpitations/no leg swelling Respiratory: no cough/no SOB/no wheezing Gastrointestinal: no N/no V/no D/no C/no acid reflux Musculoskeletal: no muscle aches/no joint aches Skin: + Rash on face (acne), no hair loss Neurological: no tremors/+ numbness/+ tingling/no dizziness  I reviewed pt's medications, allergies, PMH, social hx, family hx, and changes were documented in the history of present illness. Otherwise, unchanged from my initial visit note.  PMH: - DM1 - HL - depression   Past Surgical History:  Procedure Laterality Date  . ENDOMETRIAL ABLATION    . TUBAL LIGATION Bilateral      Social History   Social History  . Marital status: Married    Spouse name: N/A  . children: yes   Occupational History  . Flight attendant   Social History Main Topics  . Smoking status: Never Smoker  . Smokeless tobacco: Not on file  . Alcohol use No  . Drug use: No   Current Outpatient Medications on File Prior to Visit  Medication Sig Dispense Refill  . ALPRAZolam (XANAX) 0.5 MG tablet Take 0.5 mg by mouth daily as needed.     Marland Kitchen atorvastatin (LIPITOR) 10 MG tablet TAKE 1 TABLET BY MOUTH  DAILY 90 tablet 3  . Continuous Blood Gluc Transmit (DEXCOM G6 TRANSMITTER) MISC 1 Device by Does not apply route every 3 (three) months. 1 each 3  . fluconazole (DIFLUCAN) 150 MG tablet TAKE ONE TAB BY MOUTH SINGLE DOSE (Patient not taking: Reported on 03/19/2019) 1 tablet 1  . insulin lispro (HUMALOG) 100 UNIT/ML injection Inject 0.6  mLs (60 Units total) into the skin daily. Use 60 units day via insulin pump. 2 vial 3  . magnesium oxide (MAG-OX) 400 MG tablet Take 400 mg by mouth daily.    . Nutritional Supplements (JUICE PLUS FIBRE PO) Take by mouth.    [name] DELICA LANCETS FINE MISC USE 3 TIMES DAILY 300 each 5  . OVER THE COUNTER MEDICATION     . sertraline (ZOLOFT) 100 MG tablet     . valACYclovir (VALTREX) 1000 MG tablet      No current facility-administered medications on file prior to visit.    No Known Allergies   FH: - see HPI  PE: BP 120/70  Pulse 67   Ht '5\' 2"'  (1.575 m)   Wt 134 lb (60.8 kg)   SpO2 96%   BMI 24.51 kg/m  Wt Readings from Last 3 Encounters:  07/16/19 134 lb (60.8 kg)  03/19/19 140 lb (63.5 kg)  12/15/18 147 lb (66.7 kg)   Constitutional: normal weight, in NAD Eyes: PERRLA, EOMI, no exophthalmos ENT: moist mucous membranes, no thyromegaly, no cervical lymphadenopathy Cardiovascular: RRR, No MRG Respiratory: CTA B Gastrointestinal: abdomen soft, NT, ND, BS+ Musculoskeletal: no deformities, strength intact in all 4 Skin: moist, warm, no rashes Neurological: no tremor with outstretched hands, DTR normal in all 4  ASSESSMENT: 1. DM1, uncontrolled, without long-term complications, but with hyper and hypoglycemia  2. HL   3.  Acne  PLAN:  1. Patient with longstanding, uncontrolled, type 1 diabetes with improved control while on weight watchers in the past but worsening control after she came off.  However, at last visit, she was off from work due to the coronavirus pandemic and she was exercising more and taking better care of herself.  Her sugars were better and HbA1c was also improved to 8.1%. -She was previously on the Medtronic 530 G pump but was able to switch to an Omni pod DASHpump, which she likes.  However, her insurance stopped covering the libre CGM and at last visit she was off CGM but planning to restart the Dexcom G6.  I sent prescriptions to her pharmacy. -At  last visit, sugars were still fluctuating between 102 50.  They were better when she was walking but was having more lows.  We discussed about improving the way she entered her carbs that she was staggering them rather than entering them together.  We also changed her insulin to carb ratio with dinner to receive more insulin for this meal and also increase her basal rates late evening and overnight since her sugars are higher than. -At this visit, her sugars are worse as she had a busy year months in which she traveled to [place] and was also busy at home so she did not have time to walk too much.  She restarted this only 2 days ago.  She feels that her sugars are lower since then.  Otherwise, sugars remain very fluctuating, but mostly above target.  Upon questioning, she is still not having regular mealtimes and still does not introduce all the carbs when she eats, but staggering throughout the day.  She mostly grazes and we discussed that this is a problem when taking insulin boluses.  She would benefit more from structured meals, spaced approximately 5 hours apart.  I advised her to enter the amount of carbs that she plans to eat, and that the blood sugar and start the boluses 15 minutes before every meal.  At least 4 weeks prior to our next appointment, she needs to check 4 times a day at least and also it would be beneficial if she can get the Dexcom G6 sensor before then.  I advised her to call [facility] and have them send me the request for the sensor. -She will also resume exercise which will help control her blood sugars.  Therefore, I would not increase her basal rates (she is also getting more than 50% of insulin from basal rates), but I advised her to change her insulin to carb ratios with breakfast and dinner so that she gets more insulin with these meals. -  I advised her to: Patient Instructions  Please continue: - basal rates: 12  AM: 0.7 6 AM: 0.7 11 AM: 0.85 6 PM: 0.85 10 PM: 0.75 - ICR: 12  am: 13 >> 11 12 pm: 13 3 pm: 13 >> 12 - target: 110-120 - ISF: 55 - Insulin on Board: 4 hours  Please enter: - carbs (C) - sugar (S) - insulin (I) Approximately 15 minutes before each meal  Leave at least 4h between meals.  Please return in 3-4 months.  - we checked her HbA1c: 8.9% (higher) - advised to check sugars at different times of the day - 4x a day, rotating check times - advised for yearly eye exams >> she is UTD - return to clinic in 3-4 months   2. HL - Reviewed latest lipid panel from last visit, LDL very high, the rest of the fractions at goal Lab Results  Component Value Date   CHOL 259 (H) 03/19/2019   HDL 68.30 03/19/2019   LDLCALC 170 (H) 03/19/2019   TRIG 104.0 03/19/2019   CHOLHDL 4 03/19/2019  - Continues Lipitor 10 without side effects.  3. Acne -She was advised to start spironolactone, however, potassium level was high so she is not on this.  Upon her questioning, I advised her not to start it -We discussed that she is now perimenopausal and this comes with changes in her sex hormones which can occasionally lead to acne. -She will continue to follow-up with OB/GYN  - time spent with the patient: 45 min, of which >50% was spent in reviewing her pump downloads, discussing her hypo- and hyper-glycemic episodes, reviewing previous labs and pump settings and developing a plan to avoid hypo- and hyper-glycemia.  We also addressed her other questions.   Philemon Kingdom, MD PhD [facility]

## 2019-07-16 NOTE — Patient Instructions (Addendum)
Please continue: - basal rates: 12 AM: 0.7 6 AM: 0.7 11 AM: 0.85 6 PM: 0.85 10 PM: 0.75 - ICR: 12 am: 13 >> 11 12 pm: 13 3 pm: 13 >> 12 - target: 110-120 - ISF: 55 - Insulin on Board: 4 hours  Please enter: - carbs (C) - sugar (S) - insulin (I) Approximately 15 minutes before each meal  Leave at least 4h between meals.  Please return in 3-4 months.

## 2019-08-07 ENCOUNTER — Other Ambulatory Visit: Payer: Self-pay | Admitting: Internal Medicine

## 2019-09-15 ENCOUNTER — Encounter: Payer: Self-pay | Admitting: Internal Medicine

## 2019-09-15 NOTE — Progress Notes (Signed)
Received labs from PCP, drawn on 09/14/2019: CMP normal with exception of a glucose of 172.  BUN/creatinine 12/0.83, GFR 72. Lipids: 206/77/72/119

## 2019-09-25 ENCOUNTER — Other Ambulatory Visit: Payer: Self-pay | Admitting: Internal Medicine

## 2019-10-15 ENCOUNTER — Ambulatory Visit (INDEPENDENT_AMBULATORY_CARE_PROVIDER_SITE_OTHER): Payer: 59 | Admitting: Internal Medicine

## 2019-10-15 ENCOUNTER — Encounter: Payer: Self-pay | Admitting: Internal Medicine

## 2019-10-15 ENCOUNTER — Other Ambulatory Visit: Payer: Self-pay

## 2019-10-15 VITALS — BP 120/70 | HR 91 | Ht 62.0 in | Wt 133.0 lb

## 2019-10-15 DIAGNOSIS — E785 Hyperlipidemia, unspecified: Secondary | ICD-10-CM

## 2019-10-15 DIAGNOSIS — E1069 Type 1 diabetes mellitus with other specified complication: Secondary | ICD-10-CM | POA: Diagnosis not present

## 2019-10-15 DIAGNOSIS — E1065 Type 1 diabetes mellitus with hyperglycemia: Secondary | ICD-10-CM

## 2019-10-15 LAB — POCT GLYCOSYLATED HEMOGLOBIN (HGB A1C): Hemoglobin A1C: 7.9 % — AB (ref 4.0–5.6)

## 2019-10-15 NOTE — Progress Notes (Signed)
Patient ID: Brooke Shah, female   DOB: 02/16/1966, 53 y.o.   MRN: [id number]  This visit occurred during the SARS-CoV-2 public health emergency.  Safety protocols were in place, including screening questions prior to the visit, additional usage of staff PPE, and extensive cleaning of exam room while observing appropriate contact time as indicated for disinfecting solutions.   HPI: Brooke Shah is a 53 y.o.-year-old female, returning for follow-up for DM1, dx 2004, uncontrolled, without complications. Last visit 3 months ago..  Since the starting of the coronavirus pandemic she opted out from work.  She is more active and her weight improved.  She will return to work in few days.  She started on an Estradiol patch 10 days ago. Will also start Prometrium. She developed low CBGs after she started.   She is starting to use an insulin pump in 2006. She had a Revel 523 Medtronic pump since 03/2013. At last visit, she was on the 530 G Medtronic since 08/2017 (in warranty until 10/2017 as her old pump broke). She did not like the pump and we tried to change to an Omnipod but this was not approved even after peer to peer review.   She was finally able to start on the Omni pod Dash pump on 11/25/2018.  She initially had a Dexcom CGM, then a freestyle libre CGM.  She developed an allergy to the adhesive for the Libre but she then thought it could have been an allergy to her uniform.  However, her insurance stopped covering the CGM and I had to write a letter for her insurance. This did not help and she is still off the CGM. She needs to get this from a supplier ([facility]).  She has Humalog in the pump.  Reviewed HbA1c levels: Lab Results  Component Value Date   HGBA1C 8.9 (A) 07/16/2019   HGBA1C 8.1 (A) 03/19/2019   HGBA1C 9.0 (A) 12/15/2018  06/24/2017: HbA1c 8.9%  05/31/2016: HbA1c 8.6% 01/11/2016: HbA1c 8.7% 09/29/2015: HbA1c 8.9% 09/28/2014: HbA1c 9.8%  Pump settings (changes made at last  visit are shown in bold, and by her since then in blue): - basal rates: 12 AM: 0.7 6 AM: 0.7 11 AM: 0.85 6 PM: 0.85 10 PM: 0.75 - ICR: 12 am: 13 >> 11 >> 12 12 pm: 13 3 pm: 13 >> 12 - target: 110-120 - ISF: 55 - Insulin on Board: 4 hours  TDD from basal insulin:  57% (18.7 units) >> 58% >> 44% >> 42% >> 62% (16.8 units) TDD from bolus insulin:  43% (14.3 units) >> 42% >> 56% >> 58% >> 38% (10.1 units) Total daily dose: up to 40 units a day - changes infusion site: Every 3-4 days  She developed yeast infections on metformin in the past.  Pt checks her sugars 3x a day: Average  274 >> 165 +/- 123 (we will scan the reports).  She checks her sugars 3x a day: - am: 53, 84-123 since starting the estradiol patch - 2h after b'fast:n/c - lunch: (Close to breakfast):183-299 (corr. of a low) - 2h after lunch: n/c - dinner: 58-68 - 2h after dinner: 457 (checked once in last week) - bedtime: n/c - Nighttime: 62   Lowest sugar was 33 >> ... 92 >> 38 ...70s >> 53; she has hypoglycemia awareness in the 70s.  No previous hypoglycemia admissions.  She has a nonexpired glucagon kit at home. Highest sugar was 400s >> 457.  No previous DKA admissions.  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   -No CKD: Last BUN/creatinine:  Lab Results  Component Value Date   BUN 13 03/19/2019   BUN 11 03/18/2018   CREATININE 0.64 03/19/2019   CREATININE 0.70 03/18/2018  09/09/2017: 11/0.66, GFR 104, Glu 233, Aphos 124 (39-117) 06/24/2017: 12/0.62, GFR 92, glucose 138. 05/31/2016: 13/0.67, GFR 94. Normal AST and ALT. Glucose 130, ACR unable to calculate (microalbumin <0.7 mg/dL) 07/19/2015: 11/0.68, normal LFTs  -+ HL; last set of lipids: Lab Results  Component Value Date   CHOL 259 (H) 03/19/2019   HDL 68.30 03/19/2019   LDLCALC 170 (H) 03/19/2019   TRIG 104.0 03/19/2019   CHOLHDL 4 03/19/2019  09/09/2017: 198/104/67/110  06/26/2017: 189/69/69/107 05/31/2016: 189/68/67/108. 10/04/2015: 195/86/76/102 On Lipitor 20 (increased after above results returned).  - last eye exam was in 2019: No DR -She has numbness and tingling in her feet.   B12 level 648 07/19/2015.  Latest TSH was normal: Lab Results  Component Value Date   TSH 1.16 03/19/2019  09/09/2017: TSH 1.18 10/04/2015: TSH 0.780, free T4 0.90  She was previously on spironolactone but potassium increased so she had to stop.  ROS: Constitutional: no weight gain/no weight loss, no fatigue, no subjective hyperthermia, no subjective hypothermia Eyes: no blurry vision, no xerophthalmia ENT: no sore throat, no nodules palpated in neck, no dysphagia, no odynophagia, no hoarseness Cardiovascular: no CP/no SOB/no palpitations/no leg swelling Respiratory: no cough/no SOB/no wheezing Gastrointestinal: no N/no V/no D/no C/no acid reflux Musculoskeletal: no muscle aches/no joint aches Skin: no rashes, no hair loss Neurological: no tremors/+ numbness/+ tingling/no dizziness  I reviewed pt's medications, allergies, PMH, social hx, family hx, and changes were documented in the history of present illness. Otherwise, unchanged from my initial visit note.  PMH: - DM1 - HL - depression   Past Surgical History:  Procedure Laterality Date  . ENDOMETRIAL ABLATION    . TUBAL LIGATION Bilateral      Social History   Social History  . Marital status: Married    Spouse name: N/A  . children: yes   Occupational History  . Flight attendant   Social History Main Topics  . Smoking status: Never Smoker  . Smokeless tobacco: Not on file  . Alcohol use No  . Drug use: No   Current Outpatient Medications on File Prior to Visit  Medication Sig Dispense Refill  . ALPRAZolam (XANAX) 0.5 MG tablet Take 0.5 mg by mouth daily as needed.     Marland Kitchen atorvastatin (LIPITOR) 10 MG tablet TAKE 1 TABLET BY MOUTH  DAILY 90 tablet 3  . Continuous Blood Gluc Transmit  (DEXCOM G6 TRANSMITTER) MISC 1 Device by Does not apply route every 3 (three) months. 1 each 3  . fluconazole (DIFLUCAN) 150 MG tablet TAKE ONE TAB BY MOUTH SINGLE DOSE 1 tablet 1  . HUMALOG 100 UNIT/ML injection INJECT 0.6 MLS (60 UNITS  TOTAL) INTO THE SKIN DAILY  VIA INSULIN PUMP 60 mL 3  . Lancets (ONETOUCH DELICA PLUS [id number]) MISC USE 3 TIMES DAILY 300 each 3  . magnesium oxide (MAG-OX) 400 MG tablet Take 400 mg by mouth daily.    . Nutritional Supplements (JUICE PLUS FIBRE PO) Take by mouth.    Marland Kitchen OVER THE COUNTER MEDICATION     . sertraline (ZOLOFT) 100 MG tablet     . valACYclovir (VALTREX) 1000 MG tablet      No current facility-administered medications on file prior  to visit.    No Known Allergies   FH: - see HPI  PE: BP 120/70   Pulse 91   Ht 5' 2" (1.575 m)   Wt 133 lb (60.3 kg)   SpO2 97%   BMI 24.33 kg/m  Wt Readings from Last 3 Encounters:  10/15/19 133 lb (60.3 kg)  07/16/19 134 lb (60.8 kg)  03/19/19 140 lb (63.5 kg)   Constitutional: Normal weight, in NAD Eyes: PERRLA, EOMI, no exophthalmos ENT: moist mucous membranes, no thyromegaly, no cervical lymphadenopathy Cardiovascular: RRR, No MRG Respiratory: CTA B Gastrointestinal: abdomen soft, NT, ND, BS+ Musculoskeletal: no deformities, strength intact in all 4 Skin: moist, warm, no rashes Neurological: no tremor with outstretched hands, DTR normal in all 4  ASSESSMENT: 1. DM1, uncontrolled, without long-term complications, but with hyper and hypoglycemia  2. HL   PLAN:  1. Patient with longstanding, uncontrolled, type 1 diabetes, with previously improved control on weight watchers in the past but worsening control after came off.  She was previously on the Medtronic 530 G pump but she was able to switch to an Omni pod-1P 11/2018.  She likes this.  At last visit she was off CGM but was planning to restart the Dexcom G6.  I sent a prescription for this to her pharmacy.  She is still off the CGM, since  this was not approved.  She tells me that she needs to get it from [facility] so she will call them and initiate the process. -Since the coronavirus pandemic started, she was more active and her weight improved.  However, at last visit, sugars were worse as she was busy traveling to [place] and also busy at home.  Sugars were more fluctuating and we strengthened her insulin to carb ratios.  We also discussed about waiting at least 4 hours between meals, but ideally 5.  We also discussed about the importance of bolusing 15 minutes before each meal and to enter the blood sugar, carbs, and starting the bolus at the same time.  She was also planning to resume exercise, therefore, I did not increase her basal rate at that time. -At this visit, she just started on estradiol patch and her sugar started to decrease right away.  She is now having low blood sugars especially before dinner and also one low at night and 1 in the morning.  It is possible that the estradiol improved her insulin resistance so we discussed about increasing her insulin sensitivity factor, initially by 10 mg/dL and I advised her to increase it further if the sugars still drop after correction.   -She continues to have high blood sugars especially after low-dose due to overcorrection.  We reviewed the protocol for correcting low blood sugars and I advised her to try to not overcompensate for these. -Her sugars are usually at goal in the morning now but they increase significantly after breakfast and this is most likely due to her not entering all the carbs in the pump for fear of lows.  I advised her to try to do so since the sugars after breakfast are quite high.  We will decrease the basal rate at night to avoid low blood sugars overnight and will also decrease the basal rate in the afternoon since this is a time when she may develop lows. -I advised her that if she still has lows at night after making the above changes, she may relax her insulin to  carb ratio with dinner. -I introduced the changes into   her pump. -  I advised her to: Patient Instructions  Please continue: - basal rates: 12 AM: 0.65 6 AM: 0.7 11 AM: 0.85  3:30 pm: 0.75 10 PM: 0.75 - ICR: 12 am: 12 12 pm: 13 3 pm: 12 (may need to increase to 13) - target: 110-120 - ISF: 55 >> 65 (may need to increase to 70) - Insulin on Board: 4 hours  Please enter: - carbs (C) - sugar (S) - insulin (I) Approximately 15 minutes before each meal  Leave at least 4h between meals.  Please return in 3-4 months.  - we checked her HbA1c: 7.9% (better) - advised to check sugars at different times of the day - 4x a day, rotating check times - advised for yearly eye exams >> she is not UTD - return to clinic in 3-4 months  2. HL -Reviewed latest lipid panel from 03/2019: LDL very high, rest of the fractions at goal Lab Results  Component Value Date   CHOL 259 (H) 03/19/2019   HDL 68.30 03/19/2019   LDLCALC 170 (H) 03/19/2019   TRIG 104.0 03/19/2019   CHOLHDL 4 03/19/2019  -Continues Lipitor 10 without side effects.  - time spent with the patient: 40 min, of which >50% was spent in reviewing her pump downloads, discussing her hypo- and hyper-glycemic episodes, reviewing previous labs and pump settings and developing a plan to avoid hypo- and hyper-glycemia.    Cristina Gherghe, MD PhD [facility]  

## 2019-10-15 NOTE — Addendum Note (Signed)
Addended by: Cardell Peach I on: 10/15/2019 02:56 PM   Modules accepted: Orders

## 2019-10-15 NOTE — Patient Instructions (Addendum)
Please continue: - basal rates: 12 AM: 0.65 6 AM: 0.7 11 AM: 0.85  3:30 pm: 0.75 10 PM: 0.75 - ICR: 12 am: 12 12 pm: 13 3 pm: 12 (may need to increase to 13) - target: 110-120 - ISF: 55 >> 65 (may need to increase to 70) - Insulin on Board: 4 hours  Please enter: - carbs (C) - sugar (S) - insulin (I) Approximately 15 minutes before each meal  Leave at least 4h between meals.  Please return in 3-4 months.

## 2019-12-24 ENCOUNTER — Encounter: Payer: Self-pay | Admitting: Internal Medicine

## 2019-12-24 NOTE — Progress Notes (Signed)
Received labs from PCP from 12/16/2019:  CMP normal with BUN/creatinine 9/0.76, GFR 80.  Glucose 168. Lipid: 173/83/69/89

## 2020-01-15 ENCOUNTER — Ambulatory Visit: Payer: 59 | Admitting: Internal Medicine

## 2020-01-20 ENCOUNTER — Telehealth: Payer: Self-pay

## 2020-01-20 NOTE — Telephone Encounter (Signed)
Forms for insulin pump supplies filled out, signed by Dr. Elvera Lennox and faxed to [facility] with confirmation.

## 2020-03-02 ENCOUNTER — Other Ambulatory Visit: Payer: Self-pay

## 2020-03-04 ENCOUNTER — Other Ambulatory Visit: Payer: Self-pay

## 2020-03-04 ENCOUNTER — Ambulatory Visit (INDEPENDENT_AMBULATORY_CARE_PROVIDER_SITE_OTHER): Payer: 59 | Admitting: Internal Medicine

## 2020-03-04 ENCOUNTER — Encounter: Payer: Self-pay | Admitting: Internal Medicine

## 2020-03-04 VITALS — BP 120/86 | HR 88 | Ht 62.0 in | Wt 131.0 lb

## 2020-03-04 DIAGNOSIS — E1069 Type 1 diabetes mellitus with other specified complication: Secondary | ICD-10-CM | POA: Diagnosis not present

## 2020-03-04 DIAGNOSIS — E785 Hyperlipidemia, unspecified: Secondary | ICD-10-CM

## 2020-03-04 DIAGNOSIS — E1065 Type 1 diabetes mellitus with hyperglycemia: Secondary | ICD-10-CM | POA: Diagnosis not present

## 2020-03-04 LAB — POCT GLYCOSYLATED HEMOGLOBIN (HGB A1C): Hemoglobin A1C: 8.8 % — AB (ref 4.0–5.6)

## 2020-03-04 MED ORDER — GLUCAGON 3 MG/DOSE NA POWD: 3.0000 mg | Freq: Once | NASAL | 11 refills | Status: DC | PRN

## 2020-03-04 NOTE — Patient Instructions (Addendum)
Please change: - basal rates: 12 AM: 0.55 6 AM: 0.70 11 AM: 0.85 >> 0.80 3:30 pm: 0.75 8:30: 0.65 10 PM: 0.75 - ICR: 12 am: 13 12 pm: 13 3 pm: 13 - target: 110-120 - ISF: 70 - Insulin on Board: 4 hours  Try to restart the estrogen patch.  Please do the following approximately 15 minutes before every meal: - Enter carbs (C) - Enter sugars (S) - Start insulin bolus (I)  Try to eat at least 4 hours between meals.  Please return in 3-4 months.

## 2020-03-04 NOTE — Progress Notes (Signed)
Patient ID: Brooke Shah, female   DOB: [birth year], 54 y.o.   MRN: [id number]  .covidp  HPI: Brooke Shah is a 54 y.o.-year-old female, returning for follow-up for DM1, dx 2004, uncontrolled, without long-term complications. Last visit 4 months ago.  Since the starting of the coronavirus pandemic she opted out from work.  She is more active and her weight improved. She returns to work since then.  Before last visit, she was started on estradiol patch. She developed low CBGs after she started.  She is now off the patch as this was not affordable.  Insulin pump: She started to use an insulin pump in 2006. She had a Revel 523 Medtronic pump since 03/2013. Afterwards, she was on the [place] since 08/2017 (in warranty until 10/2017 as her old pump broke). She did not like the pump and we tried to change to an Omnipod but this was not approved even after peer to peer review.    She was finally able to start the OmniPod Dash pump on 11/25/2018.  CGM: She initially had a Dexcom CGM, then a freestyle libre CGM.  She developed an allergy to the adhesive for the [facility] but she then thought it could have been an allergy to her uniform.  However, her insurance stopped covering the CGM and I had to write a letter for her insurance. This did not help and she was still off the CGM at last visit.  However, she afterwards found out that the CGM was covered, but this was expensive due to her high deductible.  As of now, she met her deductible and she can restart the CGM.  This has to come from [facility].  Insulin: She uses Humalog in the pump.  Reviewed HbA1c levels: Lab Results  Component Value Date   HGBA1C 7.9 (A) 10/15/2019   HGBA1C 8.9 (A) 07/16/2019   HGBA1C 8.1 (A) 03/19/2019  06/24/2017: HbA1c 8.9%  05/31/2016: HbA1c 8.6% 01/11/2016: HbA1c 8.7% 09/29/2015: HbA1c 8.9% 09/28/2014: HbA1c 9.8%  Pump settings (changes made at last visit are shown in bold, and by her since then in  blue): - basal rates: 12 AM: 0.65 >> 0.550 6 AM: 0.7 11 AM: 0.85  3:30 pm: 0.75 8:30: 0.65 10 PM: 0.75 - ICR: 12 am: 12 >> 13 12 pm: 13 3 pm: 12 (may need to increase to 13) >> 13 - target: 110-120 - ISF: 55 >> 65 (may need to increase to 70) >> 70 - Insulin on Board: 4 hours  TDD from basal insulin:  57% (18.7 units) >> 58% >> 44% >> 42% >> 62% (16.8 units) >> 54 to 70% TDD from bolus insulin:  43% (14.3 units) >> 42% >> 56% >> 58% >> 38% (10.1 units) >> 30 to 46% Total daily dose: Up to 40 units a day - changes infusion site: Every 3 days  She developed yeast infections on Metformin in the past.  Pt checks her sugars 3x a day: Average  274 >> 165 +/- 123 >>  182+/-103 (we will scan the reports).  She checks her sugars 4 times a day: - am: 53, 84-123 since starting the estradiol patch >> 88-173, 227, 297 - 2h after b'fast:n/c - lunch: (Close to breakfast):183-299 (corr. of a low) >> 40-216 - 2h after lunch: n/c >> 215-348 - dinner: 58-68 >> LO, 152-291 - 2h after dinner: 457 (checked once in last week) - bedtime: n/c - Nighttime: 62   Lowest sugar was 33 >> ... 92 >> 38 ...70s >>  53 >> LO; she has hypoglycemia awareness in the 70s. No previous hypoglycemia admissions. She has an unexpired glucagon kit at home. Highest sugar was 400s >> 457 >> 445. No previous DKA admissions.  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 She was off work at the beginning of the coronavirus pandemic, she is now back to work.  -No CKD: Last BUN/creatinine:  12/16/2019: CMP normal with BUN/creatinine 9/0.76, GFR 80.  Glucose 168. 09/14/2019: CMP normal with exception of a glucose of 172.  BUN/creatinine 12/0.83, GFR 72. Lab Results  Component Value Date   BUN 13 03/19/2019   BUN 11 03/18/2018   CREATININE 0.64 03/19/2019   CREATININE 0.70 03/18/2018  09/09/2017: 11/0.66, GFR 104, Glu 233, Aphos 124 (39-117) 06/24/2017: 12/0.62, GFR 92, glucose 138. 05/31/2016: 13/0.67,  GFR 94. Normal AST and ALT. Glucose 130, ACR unable to calculate (microalbumin <0.7 mg/dL) 07/19/2015: 11/0.68, normal LFTs  -+ HL; last set of lipids: 12/16/2019: 173/83/69/89 09/14/2019: 206/77/72/119 Lab Results  Component Value Date   CHOL 259 (H) 03/19/2019   HDL 68.30 03/19/2019   LDLCALC 170 (H) 03/19/2019   TRIG 104.0 03/19/2019   CHOLHDL 4 03/19/2019  09/09/2017: 198/104/67/110 06/26/2017: 189/69/69/107 05/31/2016: 189/68/67/108. 10/04/2015: 195/86/76/102 On Lipitor 20.  - last eye exam was in 09/2019: reportedly No DR -+ Numbness and tingling in her feet.   B12 level was normal 648 07/19/2015.  Latest TSH was normal Lab Results  Component Value Date   TSH 1.16 03/19/2019  09/09/2017: TSH 1.18 10/04/2015: TSH 0.780, free T4 0.90  She was previously on spironolactone but potassium increased so she had to stop.  ROS: Constitutional: no weight gain/no weight loss, no fatigue, no subjective hyperthermia, no subjective hypothermia Eyes: no blurry vision, no xerophthalmia ENT: no sore throat, no nodules palpated in neck, no dysphagia, no odynophagia, no hoarseness Cardiovascular: no CP/no SOB/no palpitations/no leg swelling Respiratory: no cough/no SOB/no wheezing Gastrointestinal: no N/no V/no D/no C/no acid reflux Musculoskeletal: no muscle aches/no joint aches Skin: no rashes, no hair loss Neurological: no tremors/+ numbness/+ tingling/no dizziness  I reviewed pt's medications, allergies, PMH, social hx, family hx, and changes were documented in the history of present illness. Otherwise, unchanged from my initial visit note.  PMH: - DM1 - HL - depression   Past Surgical History:  Procedure Laterality Date  . ENDOMETRIAL ABLATION    . TUBAL LIGATION Bilateral      Social History   Social History  . Marital status: Married    Spouse name: N/A  . children: yes   Occupational History  . Flight attendant   Social History Main Topics  . Smoking status:  Never Smoker  . Smokeless tobacco: Not on file  . Alcohol use No  . Drug use: No   Current Outpatient Medications on File Prior to Visit  Medication Sig Dispense Refill  . ALPRAZolam (XANAX) 0.5 MG tablet Take 0.5 mg by mouth daily as needed.     Marland Kitchen atorvastatin (LIPITOR) 10 MG tablet TAKE 1 TABLET BY MOUTH  DAILY 90 tablet 3  . Continuous Blood Gluc Transmit (DEXCOM G6 TRANSMITTER) MISC 1 Device by Does not apply route every 3 (three) months. 1 each 3  . fluconazole (DIFLUCAN) 150 MG tablet TAKE ONE TAB BY MOUTH SINGLE DOSE 1 tablet 1  . HUMALOG 100 UNIT/ML injection INJECT 0.6 MLS (60 UNITS  TOTAL) INTO THE SKIN DAILY  VIA INSULIN PUMP 60 mL 3  . Lancets (ONETOUCH DELICA PLUS [id number]) [place]  USE 3 TIMES DAILY 300 each 3  . magnesium oxide (MAG-OX) 400 MG tablet Take 400 mg by mouth daily.    . Nutritional Supplements (JUICE PLUS FIBRE PO) Take by mouth.    Marland Kitchen OVER THE COUNTER MEDICATION     . sertraline (ZOLOFT) 100 MG tablet     . valACYclovir (VALTREX) 1000 MG tablet      No current facility-administered medications on file prior to visit.   No Known Allergies   FH: - see HPI  PE: BP 120/86   Pulse 88   Ht '5\' 2"'  (1.575 m)   Wt 131 lb (59.4 kg)   SpO2 98%   BMI 23.96 kg/m  Wt Readings from Last 3 Encounters:  03/04/20 131 lb (59.4 kg)  10/15/19 133 lb (60.3 kg)  07/16/19 134 lb (60.8 kg)   Constitutional: normal weight, in NAD Eyes: PERRLA, EOMI, no exophthalmos ENT: moist mucous membranes, no thyromegaly, no cervical lymphadenopathy Cardiovascular: RRR, No MRG Respiratory: CTA B Gastrointestinal: abdomen soft, NT, ND, BS+ Musculoskeletal: no deformities, strength intact in all 4 Skin: moist, warm, no rashes Neurological: no tremor with outstretched hands, DTR normal in all 4  ASSESSMENT: 1. DM1, uncontrolled, without long-term complications, but with hyper and hypoglycemia  2. HL   PLAN:  1. Patient with longstanding, uncontrolled, type 1 diabetes, with  previously improved control on weight watchers but worsening control after coming off. Her control improved again after she opted out of work during the coronavirus pandemic and she was more active and paying more attention to her diet. At last visit, she also started HRT and her sugars improved to the point of lows. However, at that time, she was planning to return  move back to work.-She was previously on the Medtronic 570 G pump but was able to switch to OmniPod Dashin 11/2018. She likes it. She was on CGM but was above this at last visit as the supplies had to come from [facility]. At last visit, I advised her to call them and have them send me the paperwork so she can restart on the Dexcom G6 CGM. -At last visit we again discussed about the importance of bolusing 15 minutes before each meal and enter the blood sugars, carbs, and starting the bolus at the same time. At that time, after starting estrogen she had low blood sugars especially before dinner and 1 low at night and 1 in the morning. We discussed that it was possible that estrogen improved her insulin resistance and we relaxed her ISF and also discussed about possibly relaxing her insulin to carb ratio with dinner, but I did not change this at last visit. Since she was overcompensating for her lows, we discussed about the 15-15 rule to avoid subsequent hyperglycemic spikes. We also decrease the basal rate at night and in the afternoon. -At this visit, her sugars are worse and there are possible culprits:  She is back to work   She is off the CGM - but will obtain insulin now that she met her deductible  She is off the estrogen patch as this was too expensive.  She was using the [place] before and I advised her that she may switch to [place], which is the generic patch, and is cheaper. -At this visit, reviewing her pump downloads, her sugars are usually lower in the morning so for now we will keep the same basal rates overnight -Sugars are also  lower the afternoon whenever she works to the point of lows.  We will decrease the basal rates from 11 AM to 3:30 PM -She is still not introducing all carbs into the pump and I strongly advised her to do so. -She also has staggered boluses and it would be ideal to move down into 1 bolus release approximately 4 to 5 hours between meals -She relaxed her insulin to carb ratio with dinner and her insulin sensitivity factor since last visit.  For now, I do not feel strongly about changing these, especially if she is preparing to restart estrogen -Discussed about the importance of restarting the CGM -  I advised her to: Patient Instructions  Please change: - basal rates: 12 AM: 0.55 6 AM: 0.70 11 AM: 0.85 >> 0.80 3:30 pm: 0.75 8:30: 0.65 10 PM: 0.75 - ICR: 12 am: 13 12 pm: 13 3 pm: 13 - target: 110-120 - ISF: 70 - Insulin on Board: 4 hours  Try to restart the estrogen patch.  Please do the following approximately 15 minutes before every meal: - Enter carbs (C) - Enter sugars (S) - Start insulin bolus (I)  Try to eat at least 4 hours between meals.  Please return in 3-4 months.  - we checked her HbA1c: 8.8% (higher) - advised to check sugars at different times of the day - 4-8x a day, rotating check times - advised for yearly eye exams >> she is UTD - return to clinic in 3-4 months  2. HL -Regulated lipid panel: LDL improved, at goal now: 12/16/2019: 173/83/69/89 -Continues Lipitor 20 without side effects  - time spent with the patient: 40 min, of which >50% was spent in reviewing her pump and CGM downloads, discussing her hypo- and hyper-glycemic episodes, reviewing previous labs and pump settings and developing a plan to avoid hypo- and hyper-glycemia.     Philemon Kingdom, MD PhD [facility]

## 2020-03-14 ENCOUNTER — Telehealth: Payer: Self-pay

## 2020-03-14 NOTE — Telephone Encounter (Signed)
Form for pump/CGM and Certificate/Letter of Medical Neccescity have been received, completed, and signed by Dr. Elvera Lennox.  Completed form has been faxed to [facility] with confirmation.

## 2020-03-16 ENCOUNTER — Other Ambulatory Visit: Payer: Self-pay

## 2020-03-16 ENCOUNTER — Encounter: Payer: 59 | Attending: Family Medicine | Admitting: Nutrition

## 2020-03-16 DIAGNOSIS — E1065 Type 1 diabetes mellitus with hyperglycemia: Secondary | ICD-10-CM | POA: Insufficient documentation

## 2020-03-16 NOTE — Patient Instructions (Signed)
Read over manual Attach a new sensor every 10 days Attach a new transmitter every 3 months. Call Dexcom 800 help line if questions or problems.

## 2020-03-16 NOTE — Progress Notes (Addendum)
This patient was trained on the [place].  We discussed differences between sensor readings and blood sugar readings,and she reported good understanding of this.  She was given a starter kit with a sensor and transmitter.  Lot# [id number], exp. 01/30/21. She linked her dexcom to her G6 app, and the Clarity app so that readings will go to her phone and the cloud.  She inserted a sensor into her abdomen and attached the transmitter.  She had no final questions

## 2020-07-04 ENCOUNTER — Ambulatory Visit (INDEPENDENT_AMBULATORY_CARE_PROVIDER_SITE_OTHER): Payer: 59 | Admitting: Internal Medicine

## 2020-07-04 ENCOUNTER — Encounter: Payer: Self-pay | Admitting: Internal Medicine

## 2020-07-04 ENCOUNTER — Other Ambulatory Visit: Payer: Self-pay

## 2020-07-04 VITALS — BP 120/78 | HR 79 | Ht 62.0 in | Wt 139.0 lb

## 2020-07-04 DIAGNOSIS — E1065 Type 1 diabetes mellitus with hyperglycemia: Secondary | ICD-10-CM | POA: Diagnosis not present

## 2020-07-04 DIAGNOSIS — E1069 Type 1 diabetes mellitus with other specified complication: Secondary | ICD-10-CM

## 2020-07-04 DIAGNOSIS — E785 Hyperlipidemia, unspecified: Secondary | ICD-10-CM | POA: Diagnosis not present

## 2020-07-04 LAB — POCT GLYCOSYLATED HEMOGLOBIN (HGB A1C): Hemoglobin A1C: 8.6 % — AB (ref 4.0–5.6)

## 2020-07-04 MED ORDER — LYUMJEV 100 UNIT/ML IJ SOLN: INTRAMUSCULAR | 3 refills | Status: DC

## 2020-07-04 NOTE — Progress Notes (Signed)
Patient ID: Brooke Shah, female   DOB: [birth year], 54 y.o.   MRN: [id number]  This visit occurred during the SARS-CoV-2 public health emergency.  Safety protocols were in place, including screening questions prior to the visit, additional usage of staff PPE, and extensive cleaning of exam room while observing appropriate contact time as indicated for disinfecting solutions.   HPI: Brooke Shah is a 54 y.o.-year-old female, returning for follow-up for DM1, dx 2004, uncontrolled, without long-term complications. Last visit 4 months ago.  In the past she was started on estradiol patch on  which her CBGs improved.  She was off the patch at last visit due to price, but planning to restart. She is now back on it. She stopped working in 03/2020 but will return to work in 2 more months.  She is also back on her CGM now.  Insulin pump: -She started to use an insulin pump in 2006. -She had a Revel 523 Medtronic pump since 03/2013. -Afterwards, she was on the [place] since 08/2017 (in warranty until 10/2017 as her old pump broke). She did not like the pump and we tried to change to an Omnipod but this was not approved even after peer to peer review.    -She was finally able to start the [place] in 2020  CGM: She initially had a Dexcom CGM, then a freestyle libre CGM.  She developed an allergy to the adhesive for the [facility] but she then thought it could have been an allergy to her uniform.  However, her insurance stopped covering the CGM and I had to write a letter for her insurance. This did not help and she was still off the CGM at last visit.  However, she afterwards found out that the CGM was covered, but this was expensive due to her high deductible.  As of now, she met her deductible and she can restart the CGM.   Supplies: - [place]  Insulin: -Humalog.  Reviewed HbA1c levels: Lab Results  Component Value Date   HGBA1C 8.8 (A) 03/04/2020   HGBA1C 7.9 (A) 10/15/2019    HGBA1C 8.9 (A) 07/16/2019  06/24/2017: HbA1c 8.9%  05/31/2016: HbA1c 8.6% 01/11/2016: HbA1c 8.7% 09/29/2015: HbA1c 8.9% 09/28/2014: HbA1c 9.8%  Pump settings (changes made at last visit are shown in bold, changed by her in red): - basal rates: 12 AM: 0.55 6 AM: 0.70 11 AM: 0.85 >> 0.80 3:30 pm: 0.75 8:30: 0.65 10 PM: 0.75 >> 0.65 - ICR: 12 am: 13 12 pm: 13 3 pm: 13 - target: 110-120 - ISF: 70 - Insulin on Board: 4 hours  TDD from basal insulin:  62% (16.8 units) >> 54 to 70% >> 47-64% TDD from bolus insulin: 38% (10.1 units) >> 30 to 46% >> 36-51% Total daily dose: Up to 50 units a day - changes infusion site: Every 3 days  She developed yeast infections on Metformin in the past.  Pt checks her sugars more than 4 times a day with her CGM, but unfortunately we could not download this today: Average  274 >> 165 +/- 123 >>  182+/-103 >> 255 +/-97 (we will scan the reports).  - am: 53, 84-123 since starting the estradiol patch >> 88-173, 227, 297 >> 112-216 - 2h after b'fast:n/c - lunch: (Close to breakfast):183-299 (corr. of a low) >> 40-216 >> 100-400 - 2h after lunch: n/c >> 215-348 >> 250-400 - dinner: 58-68 >> LO, 152-291 >> 253-350 - 2h after dinner: 457 (checked once in last week) >>  125-354 - bedtime: n/c - Nighttime: 62   Lowest sugar was 33 >> ... 92 >> 38 ...70s >> 53 >> LO >> 60s; she has hypoglycemia awareness in the 56s.  No previous hypoglycemia admission.  She has an unexpired glucagon kit at home. Highest sugar was 400s >> 457 >> 445 >> 400.  No previous DKA admissions.  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 She was off work at the beginning of the coronavirus pandemic, but she returned back to work before last visit.  She is now again since 03/2020.  -No CKD: Last BUN/creatinine:  12/16/2019: CMP normal with BUN/creatinine 9/0.76, GFR 80.  Glucose 168. 09/14/2019: CMP normal with exception of a glucose of 172.  BUN/creatinine 12/0.83,  GFR 72. Lab Results  Component Value Date   BUN 13 03/19/2019   BUN 11 03/18/2018   CREATININE 0.64 03/19/2019   CREATININE 0.70 03/18/2018  09/09/2017: 11/0.66, GFR 104, Glu 233, Aphos 124 (39-117) 06/24/2017: 12/0.62, GFR 92, glucose 138. 05/31/2016: 13/0.67, GFR 94. Normal AST and ALT. Glucose 130, ACR unable to calculate (microalbumin <0.7 mg/dL) 07/19/2015: 11/0.68, normal LFTs  -+ HL; last set of lipids: 12/16/2019: 173/83/69/89 09/14/2019: 206/77/72/119 Lab Results  Component Value Date   CHOL 259 (H) 03/19/2019   HDL 68.30 03/19/2019   LDLCALC 170 (H) 03/19/2019   TRIG 104.0 03/19/2019   CHOLHDL 4 03/19/2019  09/09/2017: 198/104/67/110 06/26/2017: 189/69/69/107 05/31/2016: 189/68/67/108. 10/04/2015: 195/86/76/102 On Lipitor 20.  - last eye exam was in 09/2019: Reportedly no DR -She does have numbness and tingling in her feet.   B12 level was normal 648 07/19/2015.  Latest TSH was normal Lab Results  Component Value Date   TSH 1.16 03/19/2019  09/09/2017: TSH 1.18 10/04/2015: TSH 0.780, free T4 0.90  She was previously on spironolactone but potassium increased so she had to stop.  ROS: Constitutional: no weight gain/no weight loss, no fatigue, no subjective hyperthermia, no subjective hypothermia Eyes: no blurry vision, no xerophthalmia ENT: no sore throat, no nodules palpated in neck, no dysphagia, no odynophagia, no hoarseness Cardiovascular: no CP/no SOB/no palpitations/no leg swelling Respiratory: no cough/no SOB/no wheezing Gastrointestinal: no N/no V/no D/no C/no acid reflux Musculoskeletal: no muscle aches/no joint aches Skin: no rashes, no hair loss Neurological: no tremors/+ numbness/+ tingling/no dizziness  I reviewed pt's medications, allergies, PMH, social hx, family hx, and changes were documented in the history of present illness. Otherwise, unchanged from my initial visit note.  PMH: - DM1 - HL - depression   Past Surgical History:   Procedure Laterality Date  . ENDOMETRIAL ABLATION    . TUBAL LIGATION Bilateral      Social History   Social History  . Marital status: Married    Spouse name: N/A  . children: yes   Occupational History  . Flight attendant   Social History Main Topics  . Smoking status: Never Smoker  . Smokeless tobacco: Not on file  . Alcohol use No  . Drug use: No   Current Outpatient Medications on File Prior to Visit  Medication Sig Dispense Refill  . ALPRAZolam (XANAX) 0.5 MG tablet Take 0.5 mg by mouth daily as needed.     Marland Kitchen atorvastatin (LIPITOR) 10 MG tablet TAKE 1 TABLET BY MOUTH  DAILY 90 tablet 3  . Continuous Blood Gluc Transmit (DEXCOM G6 TRANSMITTER) MISC 1 Device by Does not apply route every 3 (three) months. 1 each 3  . fluconazole (DIFLUCAN) 150 MG tablet TAKE ONE TAB  BY MOUTH SINGLE DOSE 1 tablet 1  . Glucagon 3 MG/DOSE POWD Place 3 mg into the nose once as needed for up to 1 dose. 2 each 11  . HUMALOG 100 UNIT/ML injection INJECT 0.6 MLS (60 UNITS  TOTAL) INTO THE SKIN DAILY  VIA INSULIN PUMP 60 mL 3  . Lancets (ONETOUCH DELICA PLUS [id number]) MISC USE 3 TIMES DAILY 300 each 3  . magnesium oxide (MAG-OX) 400 MG tablet Take 400 mg by mouth daily.    . Nutritional Supplements (JUICE PLUS FIBRE PO) Take by mouth.    Marland Kitchen OVER THE COUNTER MEDICATION     . sertraline (ZOLOFT) 100 MG tablet     . valACYclovir (VALTREX) 1000 MG tablet      No current facility-administered medications on file prior to visit.   No Known Allergies   FH: - see HPI  PE: BP 120/78   Pulse 79   Ht '5\' 2"'  (1.575 m)   Wt 139 lb (63 kg)   SpO2 98%   BMI 25.42 kg/m  Wt Readings from Last 3 Encounters:  07/04/20 139 lb (63 kg)  03/04/20 131 lb (59.4 kg)  10/15/19 133 lb (60.3 kg)   Constitutional: normal weight, in NAD Eyes: PERRLA, EOMI, no exophthalmos ENT: moist mucous membranes, no thyromegaly, no cervical lymphadenopathy Cardiovascular: RRR, No MRG Respiratory: CTA B Gastrointestinal:  abdomen soft, NT, ND, BS+ Musculoskeletal: no deformities, strength intact in all 4 Skin: moist, warm, no rashes Neurological: no tremor with outstretched hands, DTR normal in all 4  ASSESSMENT: 1. DM1, uncontrolled, without long-term complications, but with hyper and hypoglycemia  2. HL   PLAN:  1. Patient with longstanding, uncontrolled, type 1 diabetes, with previously improved control on weight watchers, but worsening control after coming off.  Control improved again after she opted out of work during the coronavirus pandemic and she was more active and taking her attention to her diet.  Moreover, she started HRT and sugars improved to the point of lows afterwards but at last visit she was off the estrogen patch due to price.  She was planning to restart it, though. -She was previously on Medtronic 570 G insulin pump but was able to switch OmniPod Dash01/2020.  She likes this.  However, at last visit, she was off the CGM due to problems with the supplier.  She was planning to restart the Dexcom G6 CGM. -At last visit, sugars are worse possibly due to going back to work, coming off the CGM and being off the estrogen patch.  At that time, HbA1c was 8.8%, higher and we discussed about the importance of bolusing 15 minutes before every meal and also discussed about the 21-55 year old to avoid subsequent hyperglycemic spikes after correctly low blood sugars.  We also decreased the basal rate at night and in the afternoon as she was having low blood sugars in the afternoon when working.  She was still not introducing INTO the pump and I strongly advised her to do so.  She was also using staggered boluses and I advised her to try to bolus ideally once before the meal and to try to space the meals 4 to 5 hours apart. -At this visit she is back on the CGM but we unfortunately could not download the reports since she is not feeling into the system patient was given the code to allow me to download the  reports.  However, we reviewed her pump downloads and there is a pattern of better blood sugars  in the morning, which they increased significantly during the day.  Per questioning, patient is not entering enough carbs into the pump that she is afraid that she may drop her sugars.  I strongly advised her to try to enter all the carbs since otherwise we cannot no whether her pump settings are correct.  Her sugars are also fairly high at night and will increase her basal rates overnight.  I also suggested to switch from Humalog to Lyumjev, which has a fast acting so she can inject this at the start of the meal, rather than 15 minutes before, which will help with compliance. -  I advised her to: Patient Instructions  Please change: - basal rates: 12 AM: 0.55 >> 0.60 6 AM: 0.70 11 AM: 0.80 3:30 pm: 0.75 8:30 pm: 0.65 >> 0.75 - ICR: 12 am: 13 12 pm: 13 3 pm: 13 - target: 110-120 - ISF: 70 - Insulin on Board: 4 hours  Please do the following approximately before every meal: - Enter carbs (C) - Enter sugars (S) - Start insulin bolus (I)  Please try to start Lyumjev instead of Humalog.  Please return in 3-4 months.  - we checked her HbA1c: 8.6% (slightly better) - advised to check sugars at different times of the day - 4-8x a day, rotating check times - advised for yearly eye exams >> she is UTD - We will need a TSH at next visit - return to clinic in 3-4 months  2. HL -Reviewed her latest lipid panel from 12/2019: LDL improved, at goal: 173/83/69/89 -Continues Lipitor without side effects  Philemon Kingdom, MD PhD [facility]

## 2020-07-04 NOTE — Patient Instructions (Signed)
Please change: - basal rates: 12 AM: 0.55 >> 0.60 6 AM: 0.70 11 AM: 0.80 3:30 pm: 0.75 8:30 pm: 0.65 >> 0.75 - ICR: 12 am: 13 12 pm: 13 3 pm: 13 - target: 110-120 - ISF: 70 - Insulin on Board: 4 hours  Please do the following approximately before every meal: - Enter carbs (C) - Enter sugars (S) - Start insulin bolus (I)  Please try to start Lyumjev instead of Humalog.  Please return in 3-4 months.

## 2020-07-04 NOTE — Addendum Note (Signed)
Addended by: Darliss Ridgel I on: 07/04/2020 04:45 PM   Modules accepted: Orders

## 2020-07-05 NOTE — Telephone Encounter (Signed)
I will print her a sharing code and call her to do this on her phone.  Even though she may have an account, she may not have been linked.  I will try to do this today.  Will let you know

## 2020-09-14 ENCOUNTER — Other Ambulatory Visit: Payer: Self-pay | Admitting: Internal Medicine

## 2020-10-26 ENCOUNTER — Telehealth: Payer: Self-pay | Admitting: Internal Medicine

## 2020-10-26 NOTE — Telephone Encounter (Signed)
Tresa Endo with [employer] called to check whether we received the request for the notes from the PT's last 2 visits. Advised 7 to 10 business days

## 2020-10-31 ENCOUNTER — Encounter: Payer: Self-pay | Admitting: Internal Medicine

## 2020-10-31 ENCOUNTER — Other Ambulatory Visit: Payer: Self-pay

## 2020-10-31 ENCOUNTER — Ambulatory Visit (INDEPENDENT_AMBULATORY_CARE_PROVIDER_SITE_OTHER): Payer: 59 | Admitting: Internal Medicine

## 2020-10-31 VITALS — BP 120/70 | HR 89 | Ht 62.0 in | Wt 145.2 lb

## 2020-10-31 DIAGNOSIS — E1065 Type 1 diabetes mellitus with hyperglycemia: Secondary | ICD-10-CM | POA: Diagnosis not present

## 2020-10-31 DIAGNOSIS — E875 Hyperkalemia: Secondary | ICD-10-CM | POA: Diagnosis not present

## 2020-10-31 DIAGNOSIS — E785 Hyperlipidemia, unspecified: Secondary | ICD-10-CM | POA: Diagnosis not present

## 2020-10-31 DIAGNOSIS — E1069 Type 1 diabetes mellitus with other specified complication: Secondary | ICD-10-CM

## 2020-10-31 LAB — TSH: TSH: 1.07 u[IU]/mL (ref 0.35–4.50)

## 2020-10-31 MED ORDER — ONETOUCH DELICA PLUS LANCET33G MISC: 3 refills | Status: DC

## 2020-10-31 MED ORDER — ATORVASTATIN CALCIUM 10 MG PO TABS: ORAL_TABLET | ORAL | 3 refills | Status: DC

## 2020-10-31 NOTE — Progress Notes (Signed)
Patient ID: Brooke Shah, female   DOB: 1966/05/25, 54 y.o.   MRN: [id number]  This visit occurred during the SARS-CoV-2 public health emergency.  Safety protocols were in place, including screening questions prior to the visit, additional usage of staff PPE, and extensive cleaning of exam room while observing appropriate contact time as indicated for disinfecting solutions.   HPI: Brooke Shah is a 54 y.o.-year-old female, returning for follow-up for DM1, dx 2004, uncontrolled, with complications (+ mild DR).-year-old female, returning for follow-up for DM1, dx 2004, uncontrolled, with complications (+ mild DR). Last visit 4 months ago.  In the past she was started on estradiol patch on  which her CBGs improved.  She was off the patch at last visit due to price, but planning to restart. She is now back on it. She stopped working in 03/2020 but will return to work in 2 more months.  She is also back on her CGM now.  Insulin pump: -She started to use an insulin pump in 2006. -She had a Revel 523 Medtronic pump since 03/2013. -Afterwards, she was on the [place] since 08/2017 (in warranty until 10/2017 as her old pump broke). She did not like the pump and we tried to change to an Omnipod but this was not approved even after peer to peer review.    -She was finally able to start an OmniPod DASH pump in 2020  CGM: -She initially had a Dexcom CGM, then a freestyle libre CGM.  She developed an allergy to the adhesive for the [facility] but she then thought it could have been an allergy to her uniform.  However, her insurance stopped covering the CGM and I had to write a letter for her insurance. This did not help and she afterwards found out that the CGM was covered, but this was expensive due to her high deductible. At last visit, however, she met her deductible and was preparing to restart the CGM.  Supplies: - [place]  Insulin: -At last visit she was on Humalog -now on Lyumjev - she did not feel any difference after switching to this  Reviewed HbA1c levels: 10/16/2020: HbA1c 9.2% Lab  Results  Component Value Date   HGBA1C 8.6 (A) 07/04/2020   HGBA1C 8.8 (A) 03/04/2020   HGBA1C 7.9 (A) 10/15/2019  06/24/2017: HbA1c 8.9%  05/31/2016: HbA1c 8.6% 01/11/2016: HbA1c 8.7% 09/29/2015: HbA1c 8.9% 09/28/2014: HbA1c 9.8%  Pump settings (changes made at last visit are shown in bold, changed by her in red): - basal rates: 12 AM: 0.55 >> 0.60 6 AM: 0.70 11 AM: 0.80 3:30 pm: 0.75 8:30 pm: 0.65 >> 0.75 - ICR: 12 am: 13 12 pm: 13 3 pm: 13 - target: 110-120 - ISF: 70 - Insulin on Board: 4h  TDD from basal insulin:  62% (16.8 units) >> 54 to 70% >> 47-64% >> ~50% TDD from bolus insulin: 38% (10.1 units) >> 30 to 46% >> 36-51% >> ~50% Total daily dose: Up to 50 units a day - changes infusion site: Every 3 days  She had yeast infections in the past on Metformin.  She checks her sugars more than 4 times a day with her CGM.  CGM parameters:  - Average: 182+/-103 >> 255 +/-97 >> 239+/-81 (we will scan the reports) - coefficient of variation: 33.9  - GMI 9.2%  CGM parameters:  Time in range:  - very low (<54):0.1% - low (<70): 0.9% - normal range (70-180): 24.9% - high sugars (>180): 74.1% - very high sugars (>250): 44.8%    Lowest sugar was 33 >> .Marland KitchenMarland KitchenLO >> 60s >>  52; she has hypoglycemia awareness in the 70s.  No previous hypoglycemia admissions.  She has a glucagon kit at home. Highest sugar was 445 >> 400 >> 400s.  No previous DKA admissions..  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 She was off work at the beginning of the coronavirus pandemic, but she returned back to work before last visit.  At last visit, she was again off for  -No CKD: Last BUN/creatinine:  10/16/2020: CMP normal with the exception of a glucose of 369.  BUN/creatinine 8/0.76 12/16/2019: CMP normal with BUN/creatinine 9/0.76, GFR 80.  Glucose 168. 09/14/2019: CMP normal with exception of a glucose of 172.  BUN/creatinine 12/0.83, GFR 72. Lab Results  Component Value  Date   BUN 13 03/19/2019   BUN 11 03/18/2018   CREATININE 0.64 03/19/2019   CREATININE 0.70 03/18/2018  09/09/2017: 11/0.66, GFR 104, Glu 233, Aphos 124 (39-117) 06/24/2017: 12/0.62, GFR 92, glucose 138. 05/31/2016: 13/0.67, GFR 94. Normal AST and ALT. Glucose 130, ACR unable to calculate (microalbumin <0.7 mg/dL) 07/19/2015: 11/0.68, normal LFTs  -+ HL; last set of lipids: 10/16/2020: 179/79/69/95 12/16/2019: 173/83/69/89 09/14/2019: 206/77/72/119 Lab Results  Component Value Date   CHOL 259 (H) 03/19/2019   HDL 68.30 03/19/2019   LDLCALC 170 (H) 03/19/2019   TRIG 104.0 03/19/2019   CHOLHDL 4 03/19/2019  09/09/2017: 198/104/67/110 06/26/2017: 189/69/69/107 05/31/2016: 189/68/67/108. 10/04/2015: 195/86/76/102 On Lipitor 10.  - last eye exam was in 09/2020: + mild DR reportedly. -+ numbness and tingling in her feet.   B12 level was normal 648 07/19/2015.  Latest TSH was normal Lab Results  Component Value Date   TSH 1.16 03/19/2019  09/09/2017: TSH 1.18 10/04/2015: TSH 0.780, free T4 0.90  She was previously on spironolactone but potassium increased so she had to stop. Now back on it >> latest potassium 5.8% 10/16/2020. She started to taper this to off.   ROS: Constitutional: no weight gain/no weight loss, no fatigue, no subjective hyperthermia, no subjective hypothermia Eyes: no blurry vision, no xerophthalmia ENT: no sore throat, no nodules palpated in neck, no dysphagia, no odynophagia, no hoarseness Cardiovascular: no CP/no SOB/no palpitations/no leg swelling Respiratory: no cough/no SOB/no wheezing Gastrointestinal: no N/no V/no D/no C/no acid reflux Musculoskeletal: no muscle aches/no joint aches Skin: no rashes, no hair loss Neurological: no tremors/+ numbness/+ tingling/no dizziness  I reviewed pt's medications, allergies, PMH, social hx, family hx, and changes were documented in the history of present illness. Otherwise, unchanged from my initial visit  note.  PMH: - DM1 - HL - depression   Past Surgical History:  Procedure Laterality Date  . ENDOMETRIAL ABLATION    . TUBAL LIGATION Bilateral      Social History   Social History  . Marital status: Married    Spouse name: N/A  . children: yes   Occupational History  . Flight attendant   Social History Main Topics  . Smoking status: Never Smoker  . Smokeless tobacco: Not on file  . Alcohol use No  . Drug use: No   Current Outpatient Medications on File Prior to Visit  Medication Sig Dispense Refill  . ALPRAZolam (XANAX) 0.5 MG tablet Take 0.5 mg by mouth daily as needed.     Marland Kitchen atorvastatin (LIPITOR) 10 MG tablet TAKE 1 TABLET BY MOUTH  DAILY 90 tablet 3  . Continuous Blood Gluc Transmit (DEXCOM G6 TRANSMITTER) MISC 1 Device by Does not apply route every 3 (three) months. 1 each 3  .  CONTOUR NEXT TEST test strip USE AS INSTRUCTED TO CHECK  BLOOD SUGAR 5 TIMES A DAY 500 strip 12  . fluconazole (DIFLUCAN) 150 MG tablet TAKE ONE TAB BY MOUTH SINGLE DOSE 1 tablet 1  . Glucagon 3 MG/DOSE POWD Place 3 mg into the nose once as needed for up to 1 dose. 2 each 11  . HUMALOG 100 UNIT/ML injection INJECT 0.6 MLS (60 UNITS  TOTAL) INTO THE SKIN DAILY  VIA INSULIN PUMP 60 mL 3  . Insulin Lispro-aabc (LYUMJEV) 100 UNIT/ML SOLN Inject up to 50 units a day in the insulin pump 50 mL 3  . Lancets (ONETOUCH DELICA PLUS YDXAJO87O) MISC USE 3 TIMES DAILY 300 each 3  . magnesium oxide (MAG-OX) 400 MG tablet Take 400 mg by mouth daily.    . Nutritional Supplements (JUICE PLUS FIBRE PO) Take by mouth.    Marland Kitchen OVER THE COUNTER MEDICATION     . sertraline (ZOLOFT) 100 MG tablet     . valACYclovir (VALTREX) 1000 MG tablet      No current facility-administered medications on file prior to visit.   No Known Allergies   FH: - see HPI  PE: BP 120/70   Pulse 89   Ht 5' 2" (1.575 m)   Wt 145 lb 3.2 oz (65.9 kg)   SpO2 99%   BMI 26.56 kg/m  Wt Readings from Last 3 Encounters:  10/31/20 145 lb  3.2 oz (65.9 kg)  07/04/20 139 lb (63 kg)  03/04/20 131 lb (59.4 kg)   Constitutional: normal weight, in NAD Eyes: PERRLA, EOMI, no exophthalmos ENT: moist mucous membranes, no thyromegaly, no cervical lymphadenopathy Cardiovascular: RRR, No MRG Respiratory: CTA B Gastrointestinal: abdomen soft, NT, ND, BS+ Musculoskeletal: no deformities, strength intact in all 4 Skin: moist, warm, no rashes Neurological: no tremor with outstretched hands, DTR normal in all 4  ASSESSMENT: 1. DM1, uncontrolled, with long-term complications, with hyper and hypoglycemia - + mild DR  2. HL   3.  Hyperkalemia  PLAN:  1. Patient with longstanding, uncontrolled, type 1 diabetes, with previously improved control on weight watchers, but worsening control after coming off.  Her diabetes control improved again after she opted out of work during the coronavirus pandemic and she was more active and paying more attention to her diet.  However, afterwards, she returned to work and her control deteriorated again.  At last visit, she was again off work sugars were slightly better but we could not download her CGM at that time.  It did appear that sugars were fairly high at night and we discussed about increasing her basal rates overnight.  I also suggested to switch from Humalog to Lyumjev for more flexibility in the timing of the dosing.  I explained that this was a fast acting insulin and she could inject this at the start of the meal, rather than 15 minutes before, which could help with compliance.  In the past, she was not introducing enough carbs into the pump or answered them in a staggered manner and I advised her to try to introduce all of the carbs that she plans to eat for a certain meal and bolus for them before the meal and then space the meals 4 to 5 hours apart. -At last visit, HbA1c was better, at 8.6%, however, since then, she can check by PCP and this was higher, at 9.2% on 10/16/2020 CGM  interpretation: -At today's visit, we reviewed her CGM downloads: It appears that only 25% of  values are in target range (goal >70%), while 74.1% are higher than 180 (goal <25%), and 0.9% are lower than 70 (goal <4%).  The calculated average blood sugar is 239.  The projected HbA1c for the next 3 months (GMI) is 9.0%. -Reviewing the CGM trends, the sugars appear to increase after the first meal of the day and they follow upwards trends throughout the day.  They are still high overnight but decreasing after the initial significant post dinner hyperglycemia.  We discussed that this is a sign of not enough mealtime insulin.  At this visit, we discussed about increasing her insulin with meals by strengthening her insulin to carb ratio.  I also advised her to strengthen the insulin to carb ratio even better if sugars remain high after meals.   -Since it does not appear that her sugars decrease appropriately after correction, I will also strengthen her insulin sensitivity factor.  -Since sugars later in the day are extremely high, will plan to increase her basal rates throughout the day slightly.  She is concerned about low blood sugars and we discussed to let me know about trends if she starts experiencing them.  -We will continue Lyumjev, which she likes as it is injected right before the meals, however, she mentions that she did not feel a big difference after starting this -She is interested to improve her blood sugars fast as she would need eye lid surgery >> she would like to come back in 2 months to see if her sugars improved enough to be cleared for surgery. -We discussed about the next generation of Dexcom CGM, G7, which will be integrated with her OmniPod pump.  Discussed about advantages of this system, which will most likely be available early in the new year. -She has questions about differences between the Dexcom values and her fingersticks.  Few times she saw significant differences in these values.   I advised her that this may be related to the life of this cancer, but we also discussed about the possibility of calibrating the sensor and see if the readings improve afterwards.  However, Dexcom readings are usually able to be used for insulin dosing. -  I advised her to: Patient Instructions  Please use the following pump settings: - basal rates: 12 AM: 0.60 6 AM: 0.70 11 AM: 0.80 3:30 pm: 0.75 >> 0.80 8:30 pm: 0.75 >> 0.80 - ICR: 12 am: 13 >> 11 (may need to decrease to 10 if sugars remain high) 12 pm: 13 >> 11 (may need to decrease to 10 if sugars remain high) 3 pm: 13  >> 11 (may need to decrease to 10 if sugars remain high) - target: 110-120 - ISF: 65 >> 55 - Insulin on Board: 4h  Please return in 3-4 months.  - advised to check sugars at different times of the day - 4x a day, rotating check times - advised for yearly eye exams >> she is UTD - At today's visit, we will check a TSH and an ACR  - return to clinic in 3-4 months  2. HL - Reviewed latest lipid panel from 10/2020: LDL at goal: 179/79/69/95 -Continues Lipitor 10 without side effects.  However, she has been out for 3 days so I refilled her prescription.  3.  Hyperkalemia -Reviewed together her labs from 10/2020: Potassium was high, at 5.8.   -Upon questioning, she is back, but she is trying to taper the dose to half.  I strongly encouraged her to do so.  Total time spent for the visit: 40 min, in reviewing her pump downloads, discussing her hypo- and hyper-glycemic episodes, reviewing previous labs and pump settings and developing a plan to avoid hypo- and hyper-glycemia.   Component     Latest Ref Rng & Units 10/31/2020  Microalb, Ur     0.0 - 1.9 mg/dL 0.8  Creatinine,U     mg/dL 157.8  MICROALB/CREAT RATIO     0.0 - 30.0 mg/g 0.5  TSH     0.35 - 4.50 uIU/mL 1.07  Normal ACR and TSH.  Philemon Kingdom, MD PhD [facility] Endocrinology

## 2020-10-31 NOTE — Telephone Encounter (Signed)
Fax received requesting clinical notes be faxed to [phone]. Requested clinical faxed 10/31/20

## 2020-10-31 NOTE — Patient Instructions (Addendum)
Please use the following pump settings: - basal rates: 12 AM: 0.60 6 AM: 0.70 11 AM: 0.80 3:30 pm: 0.75 >> 0.80 8:30 pm: 0.75 >> 0.80 - ICR: 12 am: 13 >> 11 (may need to decrease to 10 if sugars remain high) 12 pm: 13 >> 11 (may need to decrease to 10 if sugars remain high) 3 pm: 13  >> 11 (may need to decrease to 10 if sugars remain high) - target: 110-120 - ISF: 65 >> 55 - Insulin on Board: 4h  Please return in 3-4 months.

## 2020-11-01 LAB — MICROALBUMIN / CREATININE URINE RATIO
Creatinine,U: 157.8 mg/dL
Microalb Creat Ratio: 0.5 mg/g (ref 0.0–30.0)
Microalb, Ur: 0.8 mg/dL (ref 0.0–1.9)

## 2020-12-14 ENCOUNTER — Telehealth: Payer: Self-pay | Admitting: Nutrition

## 2020-12-14 NOTE — Telephone Encounter (Signed)
I am sorry to hear that ! I am not sure if this system has these available yet, however, please have her contact her PCP for this, he will probably be the best to know about these and prescribed them if available. Ty, C

## 2020-12-14 NOTE — Telephone Encounter (Signed)
Patient reports that she has just tested positive for covid and the urgent care said there is a Pill called " paxs lovid" that you can take, that will decrease the symptoms.  But, they would not give it to her.   She is wanting to know if you can call this in for her

## 2020-12-14 NOTE — Telephone Encounter (Signed)
Patient was contacted and told to call her family doctor.  She agreed to do this.  She reports that symptoms of fever and cough started Monday night and blood sugars have been in the 300s.  She is doing correction doses q 4 hours.  She was told to increase basal rate by 20% and if still high after 24 hours, she can increase to 30%.  She was told to continue with correction doses as needed q 4 hours.

## 2020-12-14 NOTE — Telephone Encounter (Signed)
Great! Thank you, Bonita Quin!!! C

## 2021-01-02 ENCOUNTER — Other Ambulatory Visit: Payer: Self-pay

## 2021-01-04 ENCOUNTER — Encounter: Payer: Self-pay | Admitting: Internal Medicine

## 2021-01-04 ENCOUNTER — Ambulatory Visit (INDEPENDENT_AMBULATORY_CARE_PROVIDER_SITE_OTHER): Payer: 59 | Admitting: Internal Medicine

## 2021-01-04 ENCOUNTER — Other Ambulatory Visit: Payer: Self-pay

## 2021-01-04 VITALS — BP 140/88 | HR 80 | Ht 62.0 in | Wt 151.2 lb

## 2021-01-04 DIAGNOSIS — E1069 Type 1 diabetes mellitus with other specified complication: Secondary | ICD-10-CM

## 2021-01-04 DIAGNOSIS — E875 Hyperkalemia: Secondary | ICD-10-CM

## 2021-01-04 DIAGNOSIS — E1065 Type 1 diabetes mellitus with hyperglycemia: Secondary | ICD-10-CM

## 2021-01-04 DIAGNOSIS — E785 Hyperlipidemia, unspecified: Secondary | ICD-10-CM | POA: Diagnosis not present

## 2021-01-04 LAB — BASIC METABOLIC PANEL
BUN: 11 mg/dL (ref 6–23)
CO2: 32 mEq/L (ref 19–32)
Calcium: 9.2 mg/dL (ref 8.4–10.5)
Chloride: 100 mEq/L (ref 96–112)
Creatinine, Ser: 0.68 mg/dL (ref 0.40–1.20)
GFR: 99.5 mL/min (ref >60.00)
Glucose, Bld: 151 mg/dL — ABNORMAL HIGH (ref 70–99)
Potassium: 4.4 mEq/L (ref 3.5–5.1)
Sodium: 136 mEq/L (ref 135–145)

## 2021-01-04 LAB — POCT GLYCOSYLATED HEMOGLOBIN (HGB A1C): Hemoglobin A1C: 8.6 % — AB (ref 4.0–5.6)

## 2021-01-04 MED ORDER — LYUMJEV 100 UNIT/ML IJ SOLN: 50.0000 [IU] | Freq: Every day | INTRAMUSCULAR | 3 refills | Status: DC

## 2021-01-04 NOTE — Progress Notes (Signed)
Patient ID: Brooke Shah, female   DOB: [birth year], 55 y.o.   MRN: [id number]  This visit occurred during the SARS-CoV-2 public health emergency.  Safety protocols were in place, including screening questions prior to the visit, additional usage of staff PPE, and extensive cleaning of exam room while observing appropriate contact time as indicated for disinfecting solutions.   HPI: Brooke Shah is a 55 y.o.-year-old female, returning for follow-up for DM1, dx 2004, uncontrolled, with complications (+ mild DR). Last visit 2 months ago.  She had COVID-19 at the beginning of the month when sugars are high in the 300s.  Her diabetes educator discussed with her about sick day rules. Her sugars started to improve in last week.  She will need clearance for palpebral surgery from the diabetes point of view.  DM1: Insulin pump: -She started to use an insulin pump in 2006. -She had a Revel 523 Medtronic pump since 03/2013. -Afterwards, she was on the Ventress since 08/2017 (in warranty until 10/2017 as her old pump broke). She did not like the pump and we tried to change to an Omnipod but this was not approved even after peer to peer review.    -She was finally able to start an OmniPod DASH pump in 2020  CGM: -She initially had a Dexcom CGM, then a freestyle libre CGM.  She developed an allergy to the adhesive for the [facility] but she then thought it could have been an allergy to her uniform.  However, her insurance stopped covering the CGM and I had to write a letter for her insurance. This did not help and she afterwards found out that the CGM was covered, but this was expensive due to her high deductible.  However, then she met her deductible and restarted the CGM.  Supplies: - [facility]  Insulin: -Previously on Humalog -now on Lyumjev -she did not feel any difference after switching to this  Reviewed HbA1c levels: 10/16/2020: HbA1c 9.2% Lab Results  Component Value Date   HGBA1C 8.6  (A) 07/04/2020   HGBA1C 8.8 (A) 03/04/2020   HGBA1C 7.9 (A) 10/15/2019  06/24/2017: HbA1c 8.9%  05/31/2016: HbA1c 8.6% 01/11/2016: HbA1c 8.7% 09/29/2015: HbA1c 8.9% 09/28/2014: HbA1c 9.8%  Pump settings (changes made at last visit are shown in bold) - basal rates: 12 AM: 0.60 6 AM: 0.70 11 AM: 0.80 3:30 pm: 0.75 >> 0.80 8:30 pm: 0.75 >> 0.80 - ICR: 12 am: 13 >> 12 - target: 110-120 - ISF: 65 - Insulin on Board: 4h  TDD from basal insulin:  47-64% >> ~50% >> 49-60% TDD from bolus insulin: 36-51% >> ~50% >> 40-51% Total daily dose: Up to 60 units a day - changes infusion site: Every 3 days She had yeast infections in the past on Metformin.  She checks her sugars more than 4 times a day with her CGM.  CGM parameters:  - Average: 182+/-103 >> 255 +/-97 >> 239+/-81 >> 239 - coefficient of variation: 33.9 >> 33.3% - GMI 9.2% >> 9%  CGM parameters:    Previously:   Lowest sugar was 33 >> .Marland KitchenMarland KitchenLO >> 60s >> 52 >> LO; she has hypoglycemia awareness in the 30s.  No previous hypoglycemia admissions.  She has a glucagon kit at home. Highest sugar was 445 >> 400 >> 400s >> 400s.  No previous DKA admissions.  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  She will return to work next month  -No CKD: Last BUN/creatinine:  10/16/2020:  CMP normal with the exception of a glucose of 369.  BUN/creatinine 8/0.76 12/16/2019: CMP normal with BUN/creatinine 9/0.76, GFR 80.  Glucose 168. 09/14/2019: CMP normal with exception of a glucose of 172.  BUN/creatinine 12/0.83, GFR 72. Lab Results  Component Value Date   BUN 13 03/19/2019   BUN 11 03/18/2018   CREATININE 0.64 03/19/2019   CREATININE 0.70 03/18/2018  09/09/2017: 11/0.66, GFR 104, Glu 233, Aphos 124 (39-117) 06/24/2017: 12/0.62, GFR 92, glucose 138. 05/31/2016: 13/0.67, GFR 94. Normal AST and ALT. Glucose 130, ACR unable to calculate (microalbumin <0.7 mg/dL) 07/19/2015: 11/0.68, normal LFTs  -+ HL; last set of  lipids: 10/16/2020: 179/79/69/95 12/16/2019: 173/83/69/89 09/14/2019: 206/77/72/119 Lab Results  Component Value Date   CHOL 259 (H) 03/19/2019   HDL 68.30 03/19/2019   LDLCALC 170 (H) 03/19/2019   TRIG 104.0 03/19/2019   CHOLHDL 4 03/19/2019  09/09/2017: 198/104/67/110 06/26/2017: 189/69/69/107 05/31/2016: 189/68/67/108. 10/04/2015: 195/86/76/102 On Lipitor 10.  - last eye exam was in 09/2020: + Mild DR reportedly. - she has numbness and tingling in her feet.   B12 level was normal 648 07/19/2015.  Latest TSH was normal: Lab Results  Component Value Date   TSH 1.07 10/31/2020  09/09/2017: TSH 1.18 10/04/2015: TSH 0.780, free T4 0.90  Hyperkalemia: She was previously on spironolactone but potassium increased so she had to stop.  At last visit, she was back on it >> latest potassium 5.8% 10/16/2020.  She tapered it off-now off for approximately 8 weeks.  10/16/2020: Potassium 5.8% Lab Results  Component Value Date   K 5.2 03/19/2019   K 4.8 03/18/2018   K 5.0 05/31/2016   K 5.6 (H) 01/13/2010   ROS: Constitutional: no weight gain/no weight loss, no fatigue, no subjective hyperthermia, no subjective hypothermia Eyes: no blurry vision, no xerophthalmia ENT: no sore throat, no nodules palpated in neck, no dysphagia, no odynophagia, no hoarseness Cardiovascular: no CP/no SOB/no palpitations/no leg swelling Respiratory: no cough/no SOB/no wheezing Gastrointestinal: no N/no V/no D/no C/no acid reflux Musculoskeletal: no muscle aches/no joint aches Skin: no rashes, no hair loss Neurological: no tremors/+ numbness/+ tingling/no dizziness  I reviewed pt's medications, allergies, PMH, social hx, family hx, and changes were documented in the history of present illness. Otherwise, unchanged from my initial visit note.  PMH: - DM1 - HL - depression   Past Surgical History:  Procedure Laterality Date  . ENDOMETRIAL ABLATION    . TUBAL LIGATION Bilateral      Social History    Social History  . Marital status: Married    Spouse name: N/A  . children: yes   Occupational History  . Flight attendant   Social History Main Topics  . Smoking status: Never Smoker  . Smokeless tobacco: Not on file  . Alcohol use No  . Drug use: No   Current Outpatient Medications on File Prior to Visit  Medication Sig Dispense Refill  . ALPRAZolam (XANAX) 0.5 MG tablet Take 0.5 mg by mouth daily as needed.     Marland Kitchen atorvastatin (LIPITOR) 10 MG tablet TAKE 1 TABLET BY MOUTH  DAILY 90 tablet 3  . Continuous Blood Gluc Transmit (DEXCOM G6 TRANSMITTER) MISC 1 Device by Does not apply route every 3 (three) months. 1 each 3  . CONTOUR NEXT TEST test strip USE AS INSTRUCTED TO CHECK  BLOOD SUGAR 5 TIMES A DAY 500 strip 12  . fluconazole (DIFLUCAN) 150 MG tablet TAKE ONE TAB BY MOUTH SINGLE DOSE 1 tablet 1  . Glucagon 3 MG/DOSE  POWD Place 3 mg into the nose once as needed for up to 1 dose. 2 each 11  . Insulin Lispro-aabc (LYUMJEV) 100 UNIT/ML SOLN Inject up to 50 units a day in the insulin pump 50 mL 3  . Lancets (ONETOUCH DELICA PLUS MBTDHR41U) MISC Use up to 6x a day 300 each 3  . magnesium oxide (MAG-OX) 400 MG tablet Take 400 mg by mouth daily.    . Nutritional Supplements (JUICE PLUS FIBRE PO) Take by mouth.    Marland Kitchen OVER THE COUNTER MEDICATION     . sertraline (ZOLOFT) 100 MG tablet     . valACYclovir (VALTREX) 1000 MG tablet      No current facility-administered medications on file prior to visit.   No Known Allergies   FH: - see HPI  PE: BP 140/88   Pulse 80   Ht '5\' 2"'  (1.575 m)   Wt 151 lb 3.2 oz (68.6 kg)   SpO2 99%   BMI 27.65 kg/m  Wt Readings from Last 3 Encounters:  01/04/21 151 lb 3.2 oz (68.6 kg)  10/31/20 145 lb 3.2 oz (65.9 kg)  07/04/20 139 lb (63 kg)   Constitutional: normal weight, in NAD Eyes: PERRLA, EOMI, no exophthalmos ENT: moist mucous membranes, no thyromegaly, no cervical lymphadenopathy Cardiovascular: RRR, No MRG Respiratory: CTA  B Gastrointestinal: abdomen soft, NT, ND, BS+ Musculoskeletal: no deformities, strength intact in all 4 Skin: moist, warm, no rashes Neurological: no tremor with outstretched hands, DTR normal in all 4  ASSESSMENT: 1. DM1, uncontrolled, with long-term complications, with hyper and hypoglycemia - + mild DR  2. HL   PLAN:  1. Patient with longstanding, uncontrolled, type 1 diabetes, which previously improved control on weight watchers, but worsening control after coming off.  Her diabetes control improved again after she opted out of work during the coronavirus pandemic and she improved her diet and also exercised more.  However, afterwards, she returned to work and her control deteriorated again.  We switched from Humalog to Lyumjev for increased flexibility in the timing of her insulin boluses.  We also discussed about the fact that she was not introducing enough carbs into the pump or entering them in a staggered manner and we discussed about introducing all of the carbs for the meal at the time of the mealtime bolus, at the start of the meal.  I also advised her to space the meals 4 to 5 hours apart.  Before last visit her HbA1c was 9.2%, higher, on 10/16/2020. -At last visit, reviewing her CGM downloads, only 25% of her values were in target range and sugars were increasing after the first meal of the day and continued the upwards trend throughout the day.  They were still high overnight but decreasing after an initial post dinner hyperglycemic peaks.  We discussed that this was a sign of not enough insulin with meals and will strengthen her insulin to carb ratios.  We also increased her basal rates in the afternoon and evening.  At last visit, she was interested in eyelid surgery and wanted to come back in 2 months to see if she can be cleared for surgery. CGM interpretation: -At today's visit, we reviewed her CGM downloads: It appears that 27% % of values are in target range (goal >70%), while  73% are higher than 180 (goal <25%), and 0% are lower than 70 (goal <4%).  The calculated average blood sugar is 239.  The projected HbA1c for the next 3 months (GMI) is  9.0%. -Reviewing the CGM trends, her sugars appear to have been greatly improved recent COVID-19 episode.  Her blood sugars were lower in the morning and increasing significantly towards the end of the day, peaking approximately 3 PM.  Her sugars then decrease slightly around 6 PM only to increase again after dinner and staying high until approximately 3 AM, when they start improving. -We reviewed together her pump entries and it appears that she is not always calculating the carbs she is eating correctly.  She is working on improving this.  We calculated to get the one of her dinners after which her blood sugars increased from 100 to 400 (pasta with marinara sauce without meat and bread) and she entered only 50 g of carbs rather than approximately 65, however, I would still not have expected the sugars to increase 3 times.  Therefore, at today's visit, I advised her to lower her insulin to carb ratio.  I did advise her about doing so at last visit but she only lowered the factor to 12, rather than 11 or even 10.  We did discuss that some people require more insulin for meals that contain starches, but this is not able to be introduced into the pump so we discussed that she may need to enter a slightly higher amount of carbs for such meals (possibly by 25%).  I advised her to try this first in the first half of the day and then change the percentage as needed. -Also, since sugars are very high after 11 AM, I advised her to increase all the basal rates from this time to 6 AM.  -She did not change her insulin sensitivity factor as recommended at last visit but I looked at several instances in which she corrected high blood sugars and the corrections are mostly appropriate so for now I advised him to continue the same ISF. -I also advised her to  stop bagels due to the high glycemic index and also to stop orange juice, which she continues to drink. -We discussed about the iLet insulin pump (by [employer]) for which no carbs need to be introduced -I am hoping that this will come out soon -We discussed about her upcoming surgery -she had very high blood sugars in the setting of COVID-19 infection earlier this month.  I would recommend to continue to improve her blood sugars before going to surgery.  Ideally, she would have an A1c lower than 8%, but if the sugars improve in the next few weeks, I can clear her for surgery even without a repeat HbA1c.  She agrees with this plan. -  I advised her to: Patient Instructions  Please use the following pump settings: - basal rates: 12 AM: 0.60 >> 0.70 6 AM: 0.70 11 AM: 0.80 >> 0.90 3:30 pm: 0.80 >> 0.90 8:30 pm: 0.80 >> 0.90 - ICR: 12 am: 12 >> 1:11 - target: 110-120 - ISF: 65 - Active Insulin Time: 4h  Please work on carb counting.   If you eat starches, may need to enter more carbs in the pump (+25%).  Please return in 3-4 months.  - we checked her HbA1c: 8.6% (slightly better) - advised to check sugars at different times of the day - 4x a day, rotating check times - advised for yearly eye exams >> she is UTD - return to clinic in 3-4 months  2. HL -Reviewed latest lipid panel from 10/2020: LDL was at goal as were the rest of her fractions 179/79/69/95 -She  continues on Lipitor 10 without side effects.  3.  Hyperlipidemia -Latest potassium level available for review was high, at 5.8 -She is not on spironolactone for 2 months -She would want me to recheck her potassium level.  Will do so today.  Total time spent for the visit: 40 min, in reviewing her pump downloads, discussing her hypo- and hyper-glycemic episodes, reviewing previous labs and pump settings and developing a plan to avoid hypo- and hyper-glycemia.  We also addressed the rest of her endocrine  problems.  Component     Latest Ref Rng & Units 01/04/2021  Sodium     135 - 145 mEq/L 136  Potassium     3.5 - 5.1 mEq/L 4.4  Chloride     96 - 112 mEq/L 100  CO2     19 - 32 mEq/L 32  Glucose     70 - 99 mg/dL 151 (H)  BUN     6 - 23 mg/dL 11  Creatinine     0.40 - 1.20 mg/dL 0.68  GFR     >60.00 mL/min 99.50  Calcium     8.4 - 10.5 mg/dL 9.2  Hemoglobin A1C     4.0 - 5.6 % 8.6 (A)  Potassium level is now normal.  Philemon Kingdom, MD PhD [facility]

## 2021-01-04 NOTE — Patient Instructions (Addendum)
Please use the following pump settings: - basal rates: 12 AM: 0.60 >> 0.70 6 AM: 0.70 11 AM: 0.80 >> 0.90 3:30 pm: 0.80 >> 0.90 8:30 pm: 0.80 >> 0.90 - ICR: 12 am: 12 >> 1:11 - target: 110-120 - ISF: 65 - Active Insulin Time: 4h  Please work on carb counting.   If you eat starches, may need to enter more carbs in the pump (+25%).  Please return in 3-4 months.

## 2021-01-16 ENCOUNTER — Encounter: Payer: Self-pay | Admitting: Internal Medicine

## 2021-01-18 ENCOUNTER — Other Ambulatory Visit: Payer: Self-pay | Admitting: Internal Medicine

## 2021-01-18 MED ORDER — LANTUS SOLOSTAR 100 UNIT/ML ~~LOC~~ SOPN: 20.0000 [IU] | PEN_INJECTOR | Freq: Every day | SUBCUTANEOUS | 5 refills | Status: DC

## 2021-01-18 MED ORDER — LYUMJEV KWIKPEN 100 UNIT/ML ~~LOC~~ SOPN: PEN_INJECTOR | SUBCUTANEOUS | 5 refills | Status: DC

## 2021-01-18 MED ORDER — INSULIN PEN NEEDLE 32G X 4 MM MISC: 3 refills | Status: DC

## 2021-01-18 MED ORDER — GLUCAGON 3 MG/DOSE NA POWD: 3.0000 mg | Freq: Once | NASAL | 11 refills | Status: DC | PRN

## 2021-03-21 ENCOUNTER — Encounter: Payer: Self-pay | Admitting: Internal Medicine

## 2021-03-21 DIAGNOSIS — E1065 Type 1 diabetes mellitus with hyperglycemia: Secondary | ICD-10-CM

## 2021-03-23 MED ORDER — DEXCOM G6 TRANSMITTER MISC: 1.0000 | 3 refills | Status: DC

## 2021-03-30 ENCOUNTER — Telehealth: Payer: Self-pay | Admitting: Dietician

## 2021-03-30 DIAGNOSIS — E1065 Type 1 diabetes mellitus with hyperglycemia: Secondary | ICD-10-CM

## 2021-03-30 NOTE — Telephone Encounter (Signed)
Patient called and states that [facility] still has not gotten her prescription for the Dexcom.  She states that she called [facility] who states that they reached out to our office April 29, May 5, 10, and 13 with no response.  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  Will message MD's medical assistant.  Oran Rein, RD, LDN, CDCES

## 2021-04-04 MED ORDER — DEXCOM G6 SENSOR MISC: 3 refills | Status: DC

## 2021-04-04 MED ORDER — DEXCOM G6 TRANSMITTER MISC: 1.0000 | 3 refills | Status: DC

## 2021-04-04 NOTE — Telephone Encounter (Signed)
Rx sent to [facility]

## 2021-04-05 ENCOUNTER — Encounter: Payer: Self-pay | Admitting: Internal Medicine

## 2021-04-05 ENCOUNTER — Other Ambulatory Visit: Payer: Self-pay

## 2021-04-05 ENCOUNTER — Ambulatory Visit (INDEPENDENT_AMBULATORY_CARE_PROVIDER_SITE_OTHER): Payer: 59 | Admitting: Internal Medicine

## 2021-04-05 VITALS — BP 110/80 | HR 81 | Ht 62.0 in | Wt 156.4 lb

## 2021-04-05 DIAGNOSIS — E1065 Type 1 diabetes mellitus with hyperglycemia: Secondary | ICD-10-CM | POA: Diagnosis not present

## 2021-04-05 DIAGNOSIS — E785 Hyperlipidemia, unspecified: Secondary | ICD-10-CM | POA: Diagnosis not present

## 2021-04-05 DIAGNOSIS — E1069 Type 1 diabetes mellitus with other specified complication: Secondary | ICD-10-CM

## 2021-04-05 DIAGNOSIS — E875 Hyperkalemia: Secondary | ICD-10-CM

## 2021-04-05 LAB — POCT GLYCOSYLATED HEMOGLOBIN (HGB A1C): Hemoglobin A1C: 8.8 % — AB (ref 4.0–5.6)

## 2021-04-05 MED ORDER — OMNIPOD 5 DEXG7G6 PODS GEN 5 MISC: 1.0000 | 3 refills | Status: DC

## 2021-04-05 MED ORDER — OZEMPIC (0.25 OR 0.5 MG/DOSE) 2 MG/1.5ML ~~LOC~~ SOPN: 0.5000 mg | PEN_INJECTOR | SUBCUTANEOUS | 3 refills | Status: DC

## 2021-04-05 MED ORDER — OMNIPOD 5 DEXG7G6 INTRO GEN 5 KIT: 1.0000 | PACK | 0 refills | Status: DC | PRN

## 2021-04-05 NOTE — Addendum Note (Signed)
Addended by: Kenyon Ana on: 04/05/2021 03:04 PM   Modules accepted: Orders

## 2021-04-05 NOTE — Progress Notes (Signed)
Patient ID: Brooke Shah, female   DOB: [birth year], 55 y.o.   MRN: [id number]  This visit occurred during the SARS-CoV-2 public health emergency.  Safety protocols were in place, including screening questions prior to the visit, additional usage of staff PPE, and extensive cleaning of exam room while observing appropriate contact time as indicated for disinfecting solutions.   HPI: Brooke Shah is a 55 y.o.-year-old female, returning for follow-up for DM1, dx 2004, uncontrolled, with complications (+ mild DR). Last visit 3 months ago.  Interim history: Right before last visit, she had COVID-19.  Sugars are higher.  She recovered well. She needs diabetes clearance for palpebral surgery. For the last 3 to 4 weeks, she was off her insulin pump and on insulin injections.  However, she plans to go back on the pump.  DM1: Insulin pump: -She started to use an insulin pump in 2006. -She had a Revel 523 Medtronic pump since 03/2013. -Afterwards, she was on the Carrizo since 08/2017 (in warranty until 10/2017 as her old pump broke). She did not like the pump and we tried to change to an Omnipod but this was not approved even after peer to peer review.    -She was finally able to start an OmniPod DASH pump in 2020 -She is interested in the OmniPod 5  CGM: -She initially had a Dexcom CGM, then a freestyle libre CGM.  She developed an allergy to the adhesive for the [facility] but she then thought it could have been an allergy to her uniform.  However, her insurance stopped covering the CGM and I had to write a letter for her insurance. This did not help and she afterwards found out that the CGM was covered, but this was expensive due to her high deductible.  However, then she met her deductible and restarted the CGM before last visit.  She was off the sensor for 3 to 4 weeks now due to lack of supplies. -She is interested in the Dexcom G7 CGM  Supplies: - [facility]  Insulin: -Previously on  Humalog -now on Lyumjev -she did not feel any difference after switching to this  Reviewed HbA1c levels: Lab Results  Component Value Date   HGBA1C 8.6 (A) 01/04/2021   HGBA1C 8.6 (A) 07/04/2020   HGBA1C 8.8 (A) 03/04/2020  10/16/2020: HbA1c 9.2% 06/24/2017: HbA1c 8.9%  05/31/2016: HbA1c 8.6% 01/11/2016: HbA1c 8.7% 09/29/2015: HbA1c 8.9% 09/28/2014: HbA1c 9.8%  She is on: - Lantus 20 >> 18 units at night - Lyumjev 1:10 >> 1:13  Previous pump settings: - basal rates: 12 AM: 0.60 >> 0.70 6 AM: 0.70 11 AM: 0.80 >> 0.90 3:30 pm: 0.80 >> 0.90 8:30 pm: 0.80 >> 0.90 - ICR: 12 am: 12 >> 1:11 - target: 110-120 - ISF: 65 - Active Insulin Time: 4h  TDD from basal insulin:  47-64% >> ~50% >> 49-60% TDD from bolus insulin: 36-51% >> ~50% >> 40-51% Total daily dose: Up to 60 units a day - changes infusion site: Every 3 days She had yeast infections in the past on Metformin.  She checks her blood sugars 3-4 times a day manually now in the absence of a CGM.   Previously:   Lowest sugar was 33 >>...52 >> LO >> 60s; she has hypoglycemia awareness in the 59s.  No previous hypoglycemia admissions.  She has a glucagon kit at home. Highest sugar was 400s >> 400s >> 400s.  No previous DKA admissions.  She is a Catering manager.  She usually works  12 days a month.  She will return to work next month  -No CKD: Last BUN/creatinine:  10/16/2020: CMP normal with the exception of a glucose of 369.  BUN/creatinine 8/0.76 12/16/2019: CMP normal with BUN/creatinine 9/0.76, GFR 80.  Glucose 168. 09/14/2019: CMP normal with exception of a glucose of 172.  BUN/creatinine 12/0.83, GFR 72. Lab Results  Component Value Date   BUN 11 01/04/2021   BUN 13 03/19/2019   CREATININE 0.68 01/04/2021   CREATININE 0.64 03/19/2019  09/09/2017: 11/0.66, GFR 104, Glu 233, Aphos 124 (39-117) 06/24/2017: 12/0.62, GFR 92, glucose 138. 05/31/2016: 13/0.67, GFR 94. Normal AST and ALT. Glucose 130, ACR unable to  calculate (microalbumin <0.7 mg/dL) 07/19/2015: 11/0.68, normal LFTs  -+ HL; last set of lipids: 10/16/2020: 179/79/69/95 12/16/2019: 173/83/69/89 09/14/2019: 206/77/72/119 Lab Results  Component Value Date   CHOL 259 (H) 03/19/2019   HDL 68.30 03/19/2019   LDLCALC 170 (H) 03/19/2019   TRIG 104.0 03/19/2019   CHOLHDL 4 03/19/2019  09/09/2017: 198/104/67/110 06/26/2017: 189/69/69/107 05/31/2016: 189/68/67/108. 10/04/2015: 195/86/76/102 On Lipitor 10.  - last eye exam was in 09/2020: + Mild DR reportedly. - she has numbness and tingling in her feet.   B12 level was normal 648 07/19/2015.  Latest TSH was normal: Lab Results  Component Value Date   TSH 1.07 10/31/2020  09/09/2017: TSH 1.18 10/04/2015: TSH 0.780, free T4 0.90  Hyperkalemia: She was previously on spironolactone but potassium increased so she had to stop.  At last visit, she was back on it >> potassium increased to 5.8% 10/16/2020.  At last visit, she was off spironolactone again and potassium level returned normal: Lab Results  Component Value Date   K 4.4 01/04/2021   K 5.2 03/19/2019   K 4.8 03/18/2018   K 5.0 05/31/2016   K 5.6 (H) 01/13/2010  10/16/2020: Potassium 5.8%  ROS: Constitutional: + weight gain/no weight loss, no fatigue, no subjective hyperthermia, no subjective hypothermia Eyes: no blurry vision, no xerophthalmia ENT: no sore throat, no nodules palpated in neck, no dysphagia, no odynophagia, no hoarseness Cardiovascular: no CP/no SOB/no palpitations/no leg swelling Respiratory: no cough/no SOB/no wheezing Gastrointestinal: no N/no V/no D/no C/no acid reflux Musculoskeletal: no muscle aches/no joint aches Skin: no rashes, no hair loss Neurological: no tremors/+ numbness/+ tingling/no dizziness  I reviewed pt's medications, allergies, PMH, social hx, family hx, and changes were documented in the history of present illness. Otherwise, unchanged from my initial visit note.  PMH: - DM1 -  HL - depression   Past Surgical History:  Procedure Laterality Date  . ENDOMETRIAL ABLATION    . TUBAL LIGATION Bilateral      Social History   Social History  . Marital status: Married    Spouse name: N/A  . children: yes   Occupational History  . Flight attendant   Social History Main Topics  . Smoking status: Never Smoker  . Smokeless tobacco: Not on file  . Alcohol use No  . Drug use: No   Current Outpatient Medications on File Prior to Visit  Medication Sig Dispense Refill  . ALPRAZolam (XANAX) 0.5 MG tablet Take 0.5 mg by mouth daily as needed.     Marland Kitchen atorvastatin (LIPITOR) 10 MG tablet TAKE 1 TABLET BY MOUTH  DAILY 90 tablet 3  . Continuous Blood Gluc Sensor (DEXCOM G6 SENSOR) MISC Use as instructed change every 10 days 9 each 3  . Continuous Blood Gluc Transmit (DEXCOM G6 TRANSMITTER) MISC 1 Device by Does not apply route every 3 (three)  months. 1 each 3  . CONTOUR NEXT TEST test strip USE AS INSTRUCTED TO CHECK  BLOOD SUGAR 5 TIMES A DAY 500 strip 12  . fluconazole (DIFLUCAN) 150 MG tablet TAKE ONE TAB BY MOUTH SINGLE DOSE 1 tablet 1  . Glucagon 3 MG/DOSE POWD Place 3 mg into the nose once as needed for up to 1 dose. 2 each 11  . insulin glargine (LANTUS SOLOSTAR) 100 UNIT/ML Solostar Pen Inject 20 Units into the skin daily. 15 mL 5  . Insulin Lispro-aabc (LYUMJEV) 100 UNIT/ML SOLN Inject 50 Units into the skin daily. Inject up to 50 units a day in the insulin pump 50 mL 3  . Insulin Lispro-aabc, 1 U Dial, (LYUMJEV KWIKPEN) 100 UNIT/ML SOPN Inject under skin up to 30 units a day as advised 15 mL 5  . Insulin Pen Needle 32G X 4 MM MISC Use 4x a day 200 each 3  . Lancets (ONETOUCH DELICA PLUS [id number]) MISC Use up to 6x a day 300 each 3  . magnesium oxide (MAG-OX) 400 MG tablet Take 400 mg by mouth daily.    . Nutritional Supplements (JUICE PLUS FIBRE PO) Take by mouth.    Marland Kitchen OVER THE COUNTER MEDICATION     . sertraline (ZOLOFT) 100 MG tablet     . valACYclovir  (VALTREX) 1000 MG tablet      No current facility-administered medications on file prior to visit.   No Known Allergies   FH: - see HPI  PE: BP 110/80 (BP Location: Right Arm, Patient Position: Sitting, Cuff Size: Normal)   Pulse 81   Ht '5\' 2"'  (1.575 m)   Wt 156 lb 6.4 oz (70.9 kg)   SpO2 98%   BMI 28.61 kg/m  Wt Readings from Last 3 Encounters:  04/05/21 156 lb 6.4 oz (70.9 kg)  01/04/21 151 lb 3.2 oz (68.6 kg)  10/31/20 145 lb 3.2 oz (65.9 kg)   Constitutional: normal weight, in NAD Eyes: PERRLA, EOMI, no exophthalmos ENT: moist mucous membranes, no thyromegaly, no cervical lymphadenopathy Cardiovascular: RRR, No MRG Respiratory: CTA B Gastrointestinal: abdomen soft, NT, ND, BS+ Musculoskeletal: no deformities, strength intact in all 4 Skin: moist, warm, no rashes Neurological: no tremor with outstretched hands, DTR normal in all 4  ASSESSMENT: 1. DM1, uncontrolled, with long-term complications, with hyper and hypoglycemia - + mild DR  2. HL   No FH of MTC, no personal h/o pancreatitis.  PLAN:  1. Patient with longstanding, uncontrolled, type 1 diabetes, previously improved on weight watchers, but worsening control after coming off the diet.  Her diabetes control improved again after she opted out of work during the coronavirus pandemic and she improved her diet and exercise.  However, she then returned to work and HbA1c increased to 9.2% in 10/2020, followed by 8.6% in 12/2020.  At last visit, sugars are lower in the morning and increasing significantly towards the end of the day and peaking at approximately 3 PM.  Afterwards, sugars were decreasing slightly around 6 PM only to increase again after dinner and staying high until approximately 3 AM.  Reviewing the pump entries, it appeared that she was not always correctly calculating the carbs that she was eating and she was working on improving this.  However, even when she was entering approximately the right amount of  carbs, sugars were still increasing after the meals so I did advise her to strengthen her ICRs.  We also increased her basal rates throughout the majority of the day.  We also discussed about stopping day because due to the high glycemic index and also stopping orange juice.  We discussed about entering more carbs in the pump if she was eating starches. -She needs to have eyelid surgery-needs a better A1c for this, ideally lower than 8%. -At today's visit, she is off her CGM as she was out of supplies for the last 3 to 4 weeks.  We gave her a sample of Dexcom sensors.  We did send a prescription for the sensors to [facility]. -She is now doing injections, rather than using her insulin pump.  We initially started her on insulin injections to have when flying into variability of her blood sugars during takeoff and landing.  She started this regimen approximately 1 month ago and she continues on this now.  She is, however, planning to restart the pump and she is interested in the OmniPod 5. -At this visit, sugars are lower in the morning, even in the 60s but the increase in the 200s to 400s later in the day.  She is taking Lantus at night and we discussed about the possible need to split the dose into a twice daily dose.  Also, she needs more insulin with meals.  She was reticent to use the recommended insulin to carb ratio due to fear of hypoglycemia, but for now, we discussed that with her postprandial hyperglycemia, she definitely will need more insulin with meals. -She is also worried about her weight gain (11 pounds in the last 5 months) and we discussed about off label use of Ozempic in patients with type 1 diabetes.  Discussed about criteria to stop the medication and advised her to stay very well-hydrated.  She would like to try this.  We will start at a low dose and increase as tolerated.  Given a coupon card. -  I advised her to: Patient Instructions  Please change: - Lantus 12 tonight, then 8 units in am  and 10 units at bedtime - Lyumjev:  Insulin to carb ratio: 1:10 for starchy meals; 1:12 for the rest of the meals Target: 140 Insulin sensitivity factor (correction factor): 50 This means: No extra insulin if sugars are <140 before the meals, then you had 1 unit for every 50 above 140.  Please start Ozempic 0.25 mg weekly in a.m. (for example on Sunday morning) x 2-4 weeks, then increase to 0.5 mg weekly in a.m. if no nausea or hypoglycemia.  Please return in 3-4 months.  - we checked her HbA1c: 8.8% (slightly higher) - advised to check sugars at different times of the day - 4x a day, rotating check times - advised for yearly eye exams >> she is UTD - return to clinic in 3-4 months  2. HL -Reviewed her lipid panel from 10/2020: LDL at goal, as were the rest of her fractions: 179/79/69/95 -She continues on Lipitor 10, without side effects  3.  Hyperkalemia -She had a high potassium of 5.8 in 10/2020, but repeated level at last visit was normal, 4.4, at last visit -At last visit, she was off spironolactone for 2 months and continued off the medication  Total time spent for the visit: 40 min, in reviewing her pump downloads, discussing her hypo- and hyper-glycemic episodes, reviewing previous labs and blood sugar downloads and developing a plan to avoid hypo- and hyper-glycemia.    Philemon Kingdom, MD PhD [facility]

## 2021-04-05 NOTE — Patient Instructions (Addendum)
Please change: - Lantus 12 tonight, then 8 units in am and 10 units at bedtime - Lyumjev:  Insulin to carb ratio: 1:10 for starchy meals; 1:12 for the rest of the meals Target: 140 Insulin sensitivity factor (correction factor): 50 This means: No extra insulin if sugars are <140 before the meals, then you had 1 unit for every 50 above 140.  Please start Ozempic 0.25 mg weekly in a.m. (for example on Sunday morning) x 2-4 weeks, then increase to 0.5 mg weekly in a.m. if no nausea or hypoglycemia.  Please return in 3-4 months.

## 2021-04-09 ENCOUNTER — Encounter: Payer: Self-pay | Admitting: Internal Medicine

## 2021-04-09 DIAGNOSIS — E1065 Type 1 diabetes mellitus with hyperglycemia: Secondary | ICD-10-CM

## 2021-04-10 MED ORDER — DEXCOM G6 SENSOR MISC: 3 refills | Status: DC

## 2021-04-10 MED ORDER — DEXCOM G6 TRANSMITTER MISC: 1.0000 | 3 refills | Status: DC

## 2021-04-19 MED ORDER — DEXCOM G6 SENSOR MISC: 3 refills | Status: DC

## 2021-04-19 MED ORDER — DEXCOM G6 TRANSMITTER MISC: 1.0000 | 3 refills | Status: DC

## 2021-04-19 NOTE — Addendum Note (Signed)
Addended by: Kenyon Ana on: 04/19/2021 09:54 AM   Modules accepted: Orders

## 2021-05-11 ENCOUNTER — Telehealth: Payer: Self-pay | Admitting: Pharmacy Technician

## 2021-05-11 NOTE — Telephone Encounter (Addendum)
Patient Advocate Encounter   Received notification from [facility] that prior authorization for [facility] is required.   PA submitted on 05/11/2021 Key [id number] Status is DENIED  "The request for coverage for Ozempic 2mg /1.50ml, use as directed (1.15ml per 28 days), is denied. This decision is based on health plan criteria for Ozempic. This medicine is covered only if: You have a diagnosis of type 2 diabetes mellitus. The information provided does not show that you meet the criteria listed above."  Provider notified.    [facility] will continue to follow.   4m. Netty Starring, CPhT Patient Advocate [facility] Phone: [phone] Fax:  [phone]

## 2021-05-19 NOTE — Telephone Encounter (Signed)
Pt contacted via MyChart message regarding Dexcom supplies. Ozempic was denied. Alternative medication or appeal decision?

## 2021-05-19 NOTE — Telephone Encounter (Signed)
Patient called to check status Ozempic and PA.  Call back # [phone]

## 2021-05-22 NOTE — Telephone Encounter (Signed)
Unfortunately, an appeal is unlikely to work for her, since she has type I diabetes, rather than type II.

## 2021-05-22 NOTE — Telephone Encounter (Signed)
Pt notified via My Chart

## 2021-07-30 ENCOUNTER — Other Ambulatory Visit: Payer: Self-pay | Admitting: Internal Medicine

## 2021-08-09 ENCOUNTER — Other Ambulatory Visit: Payer: Self-pay

## 2021-08-09 ENCOUNTER — Encounter: Payer: Self-pay | Admitting: Internal Medicine

## 2021-08-09 ENCOUNTER — Ambulatory Visit (INDEPENDENT_AMBULATORY_CARE_PROVIDER_SITE_OTHER): Payer: 59 | Admitting: Internal Medicine

## 2021-08-09 ENCOUNTER — Encounter: Payer: 59 | Attending: Internal Medicine | Admitting: Nutrition

## 2021-08-09 VITALS — BP 122/76 | HR 93 | Ht 62.0 in | Wt 137.0 lb

## 2021-08-09 DIAGNOSIS — E1065 Type 1 diabetes mellitus with hyperglycemia: Secondary | ICD-10-CM | POA: Diagnosis not present

## 2021-08-09 DIAGNOSIS — E785 Hyperlipidemia, unspecified: Secondary | ICD-10-CM

## 2021-08-09 DIAGNOSIS — E1069 Type 1 diabetes mellitus with other specified complication: Secondary | ICD-10-CM

## 2021-08-09 DIAGNOSIS — E663 Overweight: Secondary | ICD-10-CM | POA: Diagnosis not present

## 2021-08-09 LAB — POCT GLYCOSYLATED HEMOGLOBIN (HGB A1C): Hemoglobin A1C: 8.9 % — AB (ref 4.0–5.6)

## 2021-08-09 MED ORDER — TIRZEPATIDE 7.5 MG/0.5ML ~~LOC~~ SOAJ: 7.5000 mg | SUBCUTANEOUS | 2 refills | Status: DC

## 2021-08-09 NOTE — Patient Instructions (Addendum)
Please change: - Lantus 12 units in am and 6 units at bedtime - Lyumjev:  Insulin to carb ratio: 1:10 for starchy meals; 1:12 for the rest of the meals (1:12 for the pump) Target: 140  Insulin sensitivity factor (correction factor): 50 This means: No extra insulin if sugars are <140 before the meals, then you had 1 unit for every 50 above 140.  Please start Mounjaro 7.5 mg weekly in a.m. In 3 weeks, let me know if I can send a prescription for the higher dose to your pharmacy.  Please return in 3-4 months.

## 2021-08-09 NOTE — Progress Notes (Signed)
Patient ID: Brooke Shah, female   DOB: 12/03/1965, 55 y.o.   MRN: [id number]  This visit occurred during the SARS-CoV-2 public health emergency.  Safety protocols were in place, including screening questions prior to the visit, additional usage of staff PPE, and extensive cleaning of exam room while observing appropriate contact time as indicated for disinfecting solutions.   HPI: Brooke Shah is a 55 y.o.-year-old female, returning for follow-up for DM1, dx 2004, uncontrolled, with complications (+ mild DR). Last visit 4 months ago.  Interim history: 3 to 4 weeks before last visit she switched from insulin pump to insulin injections.  However, she was planning to go back on the pump.  She did not start it yet but she obtained the OmniPod 5 and has it with her today. Since last visit, she started on Ozempic 6 weeks ago.  She obtains it out-of-pocket.  She is taking the 1 mg dose.  No nausea or other GI side effects.  She was able to lose 19 pounds after starting it! No increased urination, blurry vision, nausea, chest pain.  DM1: Insulin pump: -She started to use an insulin pump in 2006. -She had a Revel 523 Medtronic pump since 03/2013. -Afterwards, she was on the [place] since 08/2017 (in warranty until 10/2017 as her old pump broke). She did not like the pump and we tried to change to an Omnipod but this was not approved even after peer to peer review.    -She was finally able to start an OmniPod DASH pump in 2020 -but now off the pump -She has the OmniPod 5 - needs to attach it  CGM: -She initially had a Dexcom CGM, then a freestyle libre CGM.  She developed an allergy to the adhesive for the [facility] but she then thought it could have been an allergy to her uniform.  However, her insurance stopped covering the CGM and I had to write a letter for her insurance. This did not help and she afterwards found out that the CGM was covered, but this was expensive due to her high  deductible.  However, then she met her deductible and restarted the CGM before last visit.   -At last visit she was off the sensor for almost a month due to lack of supplies  -She is interested in the [place] CGM  Supplies: - [place]  Insulin: -Previously on Humalog -now on Lyumjev -she did not feel any difference after switching to this  Reviewed HbA1c levels: Lab Results  Component Value Date   HGBA1C 8.8 (A) 04/05/2021   HGBA1C 8.6 (A) 01/04/2021   HGBA1C 8.6 (A) 07/04/2020  10/16/2020: HbA1c 9.2% 06/24/2017: HbA1c 8.9%  05/31/2016: HbA1c 8.6% 01/11/2016: HbA1c 8.7% 09/29/2015: HbA1c 8.9% 09/28/2014: HbA1c 9.8%  She is on: - Ozempic 1 mg q weekly - for the last 6 weeks (pays 200$) - Lantus 20 units daily >> 18 units daily >> 10 units in am and 8 units at bedtime - Lyumjev:  Insulin to carb ratio: 1:13 for all meals >>  >>  1 units for b'fast 3-4 units for lunch 3-7 units for dinner Target: 140 Insulin sensitivity factor (correction factor): 50 This means: No extra insulin if sugars are <140 before the meals, then you had 1 unit for every 50 above 140.  Previous pump settings: - basal rates: 12 AM: 0.60 >> 0.70 6 AM: 0.70 11 AM: 0.80 >> 0.90 3:30 pm: 0.80 >> 0.90 8:30 pm: 0.80 >> 0.90 - ICR: 12 am:  12 >> 1:11 - target: 110-120 - ISF: 65 - Active Insulin Time: 4h  TDD from basal insulin:  47-64% >> ~50% >> 49-60%  TDD from bolus insulin: 36-51% >> ~50% >> 40-51% Total daily dose: Up to 60 units a day - changes infusion site: Every 3 days She had yeast infections in the past on Metformin.  She checks her blood sugars >4 times a day with her CGM:   Previously:   Previously:   Lowest sugar was 33 >>...LO >> 60s >> 80s; she has hypoglycemia awareness in the 52s.  No previous hypoglycemia admissions.  She has a glucagon kit at home. Highest sugar was 400s >> 400s >> 400s.  No previous DKA admissions.  She is a Catering manager.  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   -No CKD: Last BUN/creatinine:  Lab Results  Component Value Date   BUN 11 01/04/2021   BUN 13 03/19/2019   CREATININE 0.68 01/04/2021   CREATININE 0.64 03/19/2019  10/16/2020: CMP normal with the exception of a glucose of 369.  BUN/creatinine 8/0.76 12/16/2019: CMP normal with BUN/creatinine 9/0.76, GFR 80.  Glucose 168. 09/14/2019: CMP normal with exception of a glucose of 172.  BUN/creatinine 12/0.83, GFR 72. 09/09/2017: 11/0.66, GFR 104, Glu 233, Aphos 124 (39-117) 06/24/2017: 12/0.62, GFR 92, glucose 138. 05/31/2016: 13/0.67, GFR 94. Normal AST and ALT. Glucose 130, ACR unable to calculate (microalbumin <0.7 mg/dL) 07/19/2015: 11/0.68, normal LFTs  -+ HL; last set of lipids: 10/16/2020: 179/79/69/95 12/16/2019: 173/83/69/89 09/14/2019: 206/77/72/119 Lab Results  Component Value Date   CHOL 259 (H) 03/19/2019   HDL 68.30 03/19/2019   LDLCALC 170 (H) 03/19/2019   TRIG 104.0 03/19/2019   CHOLHDL 4 03/19/2019  09/09/2017: 198/104/67/110 06/26/2017: 189/69/69/107 05/31/2016: 189/68/67/108. 10/04/2015: 195/86/76/102 On Lipitor 10.  - last eye exam was in 09/2020: + Mild DR reportedly.  - she has numbness and tingling in her feet.   B12 level was normal 648 07/19/2015.  Latest TSH was normal: Lab Results  Component Value Date   TSH 1.07 10/31/2020  09/09/2017: TSH 1.18 10/04/2015: TSH 0.780, free T4 0.90  She has a history of hyperkalemia -normalized: She was previously on spironolactone but potassium increased so she had to stop.  Afterwards, she was back on it >> potassium increased to 5.8% 10/16/2020.  After stopping again spironolactone, potassium level returned normal: Lab Results  Component Value Date   K 4.4 01/04/2021   K 5.2 03/19/2019   K 4.8 03/18/2018   K 5.0 05/31/2016   K 5.6 (H) 01/13/2010  10/16/2020: Potassium 5.8%  ROS: Constitutional: + weight loss, no fatigue, no subjective hyperthermia, no subjective hypothermia Eyes: no blurry vision,  no xerophthalmia ENT: no sore throat, no nodules palpated in neck, no dysphagia, no odynophagia, no hoarseness Cardiovascular: no CP/no SOB/no palpitations/no leg swelling Respiratory: no cough/no SOB/no wheezing Gastrointestinal: no N/no V/no D/no C/no acid reflux Musculoskeletal: no muscle aches/no joint aches Skin: no rashes, no hair loss Neurological: no tremors/+ numbness/+ tingling/no dizziness  I reviewed pt's medications, allergies, PMH, social hx, family hx, and changes were documented in the history of present illness. Otherwise, unchanged from my initial visit note.  PMH: - DM1 - HL - depression   Past Surgical History:  Procedure Laterality Date   ENDOMETRIAL ABLATION     TUBAL LIGATION Bilateral      Social History   Social History   Marital status: Married    Spouse name: N/A   children: yes   Occupational  History   Flight attendant   Social History Main Topics   Smoking status: Never Smoker   Smokeless tobacco: Not on file   Alcohol use No   Drug use: No   Current Outpatient Medications on File Prior to Visit  Medication Sig Dispense Refill   atorvastatin (LIPITOR) 10 MG tablet TAKE 1 TABLET BY MOUTH  DAILY 90 tablet 3   Continuous Blood Gluc Sensor (DEXCOM G6 SENSOR) MISC Use as instructed change every 10 days 9 each 3   Continuous Blood Gluc Transmit (DEXCOM G6 TRANSMITTER) MISC 1 Device by Does not apply route every 3 (three) months. 1 each 3   CONTOUR NEXT TEST test strip USE AS INSTRUCTED TO CHECK  BLOOD SUGAR 5 TIMES A DAY 500 strip 12   fluconazole (DIFLUCAN) 150 MG tablet TAKE ONE TAB BY MOUTH SINGLE DOSE (Patient not taking: Reported on 04/05/2021) 1 tablet 1   GLOBAL EASE INJECT PEN NEEDLES 32G X 4 MM MISC USE FOUR times a DAY 200 each 0   Glucagon 3 MG/DOSE POWD Place 3 mg into the nose once as needed for up to 1 dose. 2 each 11   Insulin Disposable Pump (OMNIPOD 5 G6 INTRO, GEN 5,) KIT 1 each by Does not apply route as needed. 1 kit 0    Insulin Disposable Pump (OMNIPOD 5 G6 POD, GEN 5,) MISC 1 each by Does not apply route every 3 (three) days. 30 each 3   insulin glargine (LANTUS SOLOSTAR) 100 UNIT/ML Solostar Pen Inject 20 Units into the skin daily. 15 mL 5   Insulin Lispro-aabc (LYUMJEV) 100 UNIT/ML SOLN Inject 50 Units into the skin daily. Inject up to 50 units a day in the insulin pump 50 mL 3   Insulin Lispro-aabc, 1 U Dial, (LYUMJEV KWIKPEN) 100 UNIT/ML SOPN Inject under skin up to 30 units a day as advised 15 mL 5   Lancets (ONETOUCH DELICA PLUS EBXIDH68S) MISC Use up to 6x a day 300 each 3   magnesium oxide (MAG-OX) 400 MG tablet Take 400 mg by mouth daily.     Nutritional Supplements (JUICE PLUS FIBRE PO) Take by mouth.     OVER THE COUNTER MEDICATION      Semaglutide,0.25 or 0.5MG /DOS, (OZEMPIC, 0.25 OR 0.5 MG/DOSE,) 2 MG/1.5ML SOPN Inject 0.5 mg into the skin once a week. 4.5 mL 3   sertraline (ZOLOFT) 100 MG tablet      valACYclovir (VALTREX) 1000 MG tablet      No current facility-administered medications on file prior to visit.   No Known Allergies   FH: - see HPI  PE: BP 122/76 (BP Location: Left Arm, Patient Position: Sitting, Cuff Size: Normal)   Pulse 93   Ht 5\' 2"  (1.575 m)   Wt 137 lb (62.1 kg)   SpO2 97%   BMI 25.06 kg/m  Wt Readings from Last 3 Encounters:  08/09/21 137 lb (62.1 kg)  04/05/21 156 lb 6.4 oz (70.9 kg)  01/04/21 151 lb 3.2 oz (68.6 kg)   Constitutional: normal weight, in NAD Eyes: PERRLA, EOMI, no exophthalmos ENT: moist mucous membranes, no thyromegaly, no cervical lymphadenopathy Cardiovascular: RRR, No MRG Respiratory: CTA B Gastrointestinal: abdomen soft, NT, ND, BS+ Musculoskeletal: no deformities, strength intact in all 4 Skin: moist, warm, no rashes Neurological: no tremor with outstretched hands, DTR normal in all 4  ASSESSMENT: 1. DM1, uncontrolled, with long-term complications, with hyper and hypoglycemia - + mild DR  2. HL   3.  Overweight  No  FH of  MTC, no personal h/o pancreatitis.  PLAN:  1. Patient with longstanding, uncontrolled, type 1 diabetes, previously improved when she was on the weight watchers diet but then worsening after she came off the diet.  Her diabetes control improved again after she opted out of work during the coronavirus pandemic and she improved her diet and exercise.  However, she had to return to work afterwards and HbA1c worsened.  Before last visit, she switched back from the insulin pump to insulin injections as she was out of supplies for the CGM.  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  She did observe significant variability in her blood sugars during work hours. -At last visit we restarted her CGM and gave her samples for the Dexcom sensor.  She is interested in the [place] which will likely become available towards the end of the year.  Discussed about changes in the sensor compared to the previous, G6, version including smaller size, longer life of 14 days, shorter warm-up period of 30 minutes versus 2 hours, and  excellent MARD. -At last visit, as she was on Lantus and Lyumjev injections, I advised her to split the Lantus dose and also to strengthen the insulin to carb ratios and use different ratios for starchy and nonstarchy meals.  At that time, HbA1c was higher, at 8.8%.  We tried to start [place] but this was not covered for her. -At this visit, she is back on her [place] but she was able to obtain the Omnipod5 which needs to be attached.  Discussed that the OmniPod 5 will communicate with her Dexcom CGM.  I think this would greatly help her.  She will schedule an appointment with diabetes educator as soon as possible. CGM interpretation: -At today's visit, we reviewed her CGM downloads: It appears that 19.1% of values are in target range (goal >70%), while 80.6% are higher than  180 (goal <25%), and 0% are lower than 70 (goal <4%).  The calculated average blood sugar is 267.  The projected HbA1c for the next 3 months (GMI) is 9.7%. -Reviewing the CGM trends, the sugars are extremely high.  Decreasing overnight even without correction so we discussed about decreasing the dose of Lantus at bedtime and increasing the dose in the morning.  After breakfast, sugars increase abruptly significantly and abruptly and upon questioning, she is taking a very low amount of insulin with his meals, 1 unit, as she is afraid that she would drop her blood sugars afterwards.  We discussed that this is not enough.  She is using a more relaxed insulin to carb ratio than suggested at last visit.  At this visit, I again advised her to take an insulin to carb ratio 1:10 for starchy meals and 1: 12 for the rest of the meals.  She will need to round up whenever she gives herself a bolus.  If she is able to switch to the OmniPod 5 insulin pump, we can probably start with the ICR of 1:12 for all meals. -Later in the day, sugars are usually elevated throughout so increasing the dose of Lantus in the morning will likely help.  After switching to the OmniPod pump, this problem would be automatically corrected. -She would like to continue on the GLP-1 receptor agonist, but Ozempic is expensive (she weighs $200).  She had good success with it  in losing weight and she is tolerating it well.  I suggested a change to [facility] and suggested the discount cards to get it for $25 a month.  She agrees to try this.  I advised her to let me know in 3 weeks if we can increase the dose. -  I advised her to: Patient Instructions  Please change: - Lantus 12 units in am and 6 units at bedtime - Lyumjev:  Insulin to carb ratio: 1:10 for starchy meals; 1:12 for the rest of the meals (1:12 for the pump) Target: 140  Insulin sensitivity factor (correction factor): 50 This means: No extra insulin if sugars are <140 before the  meals, then you had 1 unit for every 50 above 140.  Please start Mounjaro 7.5 mg weekly in a.m. In 3 weeks, let me know if I can send a prescription for the higher dose to your pharmacy.  Please return in 3-4 months. - we checked her HbA1c: 8.9% (lower) - advised to check sugars at different times of the day - 4x a day, rotating check times - advised for yearly eye exams >> she is UTD - return to clinic in 3-4 months  2. HL - Reviewed latest lipid panel from 10/2020: LDL decreased, at goal, as were the rest of her fractions:179/79/69/95 -She continues Lipitor 10 mg daily without side effects  3.  Overweight -Before last visit she gained 11 pounds in the previous 5 months -At last visit, she wanted to try a GLP-1 receptor agonist and I sent a prescription for Ozempic to her pharmacy.  This was not covered with insurance, and a PA was denied.  However, she was started to get it o -At this visit, I suggested Mounjaro, for which he can use a discount card.  Explained benefits and possible side effects.  She agrees to try this.  We will start at a low dose and increase as tolerated.  Philemon Kingdom, MD PhD [facility]

## 2021-08-10 NOTE — Patient Instructions (Signed)
Read over manual Call pump help line if questions.

## 2021-08-10 NOTE — Progress Notes (Signed)
Patient was trained on how to use the OmniPod 5.  Her Dexcom was linked to the pod, and the readings are linked to [place] [facility].  Settings were put in by patient and myself:  Basal rate: 0.75, I/C: 12, ISF: 50, target: 130 with corrections over 130.   We discussed how this pod will stop the flow of insulin 30 min. Before she drops low, and the need to put in the correct carbs eaten for each meal.  Blood sugar was 309 with no IOB and so she gave a correction bolus at 12PM.   She did however take her Lantus last night and again at 9AM this morning.  Her pump was put in a 100% reduction mode for 12 hours, and she will continue this until 9AM tomorrow morning.  Steps were written for her to do this, and she reported good understanding of this.  The pump was not put in the automated mode, due to the need of stopping all basal insulin.  She was again shown how to do this, and verbalized the need to do this at [facility] tomorrow when the basal insulin begins.   We also discussed how this pump will need about 2 pod changes before it will start to truly control highs and lows, and the need for patience and care in monitoring her blood sugar readings. We reviewed steps to treat lows and highs and she reported good understanding of this. She signed the checklist as understanding all topics with no final questions.

## 2021-08-14 ENCOUNTER — Telehealth: Payer: Self-pay | Admitting: Nutrition

## 2021-08-14 NOTE — Telephone Encounter (Signed)
Left message on my machine saying she reported no difficulties using the pump.  One low while working-- in the low 70s, and one no communication alert for a few minutes, but overall is "good".   Called patient back to see if we can get together on Wednesdays to finish training.  Times given

## 2021-08-14 NOTE — Telephone Encounter (Signed)
Message left on machine to call me to let me know how she is doing on her OmniPod 5 pump, and to schedule follow up for final pump training this week.

## 2021-08-16 ENCOUNTER — Encounter: Payer: 59 | Admitting: Nutrition

## 2021-08-16 NOTE — Telephone Encounter (Signed)
Patient reports that she is doing well with her pump, and that she has had no lows.  Says she has read the manual, and has no questions for me at this time.  She reports that she has not contacted customer care to link to the practice.  I told her that she needs to do this today.  For some reason, she has 2 omnipod accounts and linking was not possible when she was started on the 5 pump.

## 2021-09-05 ENCOUNTER — Encounter: Payer: Self-pay | Admitting: Internal Medicine

## 2021-09-12 NOTE — Telephone Encounter (Signed)
I have contaced Brooke Shah with OmniPod to see if she can fix her account and link her to our practice.

## 2021-09-13 ENCOUNTER — Encounter: Payer: Self-pay | Admitting: Internal Medicine

## 2021-09-14 ENCOUNTER — Other Ambulatory Visit (HOSPITAL_COMMUNITY): Payer: Self-pay

## 2021-09-14 ENCOUNTER — Telehealth: Payer: Self-pay | Admitting: Pharmacy Technician

## 2021-09-14 NOTE — Telephone Encounter (Signed)
Patient returned call and is scheduled for appointment on 09/15/21 at 2:20 pm.

## 2021-09-14 NOTE — Telephone Encounter (Signed)
Converting to telephone note. 

## 2021-09-14 NOTE — Telephone Encounter (Signed)
-----   Message from [place], [place] sent at 09/14/2021  9:04 AM EDT ----- Regarding: Greggory Keen PA Can we get a PA initiated for [employer] [facility] [employer]? Thank you  Stay safe Tileshia R RMA

## 2021-09-14 NOTE — Telephone Encounter (Signed)
[employer] rep contacted. Message left awaiting a call back.

## 2021-09-14 NOTE — Telephone Encounter (Signed)
PA has been submitted.

## 2021-09-14 NOTE — Telephone Encounter (Signed)
Patient Advocate Encounter  Received notification from [employer] that prior authorization for [facility] 7.5MG /0.5ML is required.   PA submitted on 11.3.22 Key [id number] Status is pending   [facility] will continue to follow  Ricke Hey, CPhT Patient Advocate [facility] Phone: [phone] Fax:  [phone]

## 2021-09-15 ENCOUNTER — Encounter: Payer: Self-pay | Admitting: Internal Medicine

## 2021-09-15 ENCOUNTER — Other Ambulatory Visit: Payer: Self-pay

## 2021-09-15 ENCOUNTER — Ambulatory Visit (INDEPENDENT_AMBULATORY_CARE_PROVIDER_SITE_OTHER): Payer: 59 | Admitting: Internal Medicine

## 2021-09-15 VITALS — BP 124/82 | HR 99 | Ht 62.0 in | Wt 130.8 lb

## 2021-09-15 DIAGNOSIS — E1065 Type 1 diabetes mellitus with hyperglycemia: Secondary | ICD-10-CM

## 2021-09-15 DIAGNOSIS — E785 Hyperlipidemia, unspecified: Secondary | ICD-10-CM

## 2021-09-15 DIAGNOSIS — E1069 Type 1 diabetes mellitus with other specified complication: Secondary | ICD-10-CM | POA: Diagnosis not present

## 2021-09-15 DIAGNOSIS — E663 Overweight: Secondary | ICD-10-CM | POA: Diagnosis not present

## 2021-09-15 MED ORDER — TIRZEPATIDE 7.5 MG/0.5ML ~~LOC~~ SOAJ: 7.5000 mg | SUBCUTANEOUS | 5 refills | Status: DC

## 2021-09-15 NOTE — Progress Notes (Signed)
Patient ID: Brooke Shah, female   DOB: 10/03/1966, 55 y.o.   MRN: [id number]  This visit occurred during the SARS-CoV-2 public health emergency.  Safety protocols were in place, including screening questions prior to the visit, additional usage of staff PPE, and extensive cleaning of exam room while observing appropriate contact time as indicated for disinfecting solutions.   HPI: Brooke Shah is a 55 y.o.-year-old female, returning for follow-up for DM1, dx 2004, uncontrolled, with complications (+ mild DR). Last visit 4 months ago.  Interim history: Since last visit, she was able to change from insulin injections to an OmniPod 5 CGM.  However, she had a low blood sugar episode when flying, so she is now back on injections.  She switches back and forth between the pump and injections depending on if she works or not.  In the last 3 weeks, however, she was off the pump.  She just changed to eat again today. No increased urination, blurry vision, nausea, chest pain. She lost weight after starting Mounjaro since last visit.  However, she has problems obtaining it from the pharmacy.  DM1: Insulin pump: -She started to use an insulin pump in 2006. -She had a Revel 523 Medtronic pump since 03/2013. -Afterwards, she was on the [place] since 08/2017 (in warranty until 10/2017 as her old pump broke). She did not like the pump and we tried to change to an Omnipod but this was not approved even after peer to peer review.    -She was finally able to start an OmniPod DASH pump in 2020  -At our visit from 07/2021, she was on basal-bolus insulin regimen -She started the OmniPod 5 08/2021  CGM: -She initially had a Dexcom CGM, then a freestyle libre CGM.  She developed an allergy to the adhesive for the [facility] but she then thought it could have been an allergy to her uniform.  However, her insurance stopped covering the CGM and I had to write a letter for her insurance. This did not help and  she afterwards found out that the CGM was covered, but this was expensive due to her high deductible.  However, then she met her deductible and restarted the CGM before last visit.   -Previously off the sensor for almost a month due to lack of supplies  -Now Dexcom G6 -She is interested in the Dexcom G7 CGM  Supplies: - [place]  Insulin: -Previously on Humalog -now on Lyumjev -she did not feel any difference after switching to this  Reviewed HbA1c levels: Lab Results  Component Value Date   HGBA1C 8.9 (A) 08/09/2021   HGBA1C 8.8 (A) 04/05/2021   HGBA1C 8.6 (A) 01/04/2021  10/16/2020: HbA1c 9.2% 06/24/2017: HbA1c 8.9%  05/31/2016: HbA1c 8.6% 01/11/2016: HbA1c 8.7% 09/29/2015: HbA1c 8.9% 09/28/2014: HbA1c 9.8%  When flying: - Lantus 20 units daily >> 18 units daily >> 10 units in am and 8 units at bedtime - Lyumjev:  Insulin to carb ratio: 1:13 for all meals >>  >>  1 units for b'fast 3-4 units for lunch 3-7 units for dinner Target: 140 Insulin sensitivity factor (correction factor): 50 This means: No extra insulin if sugars are <140 before the meals, then you had 1 unit for every 50 above 140.  Now: - Changed from Ozempic 1 mg weekly >> Mounjaro 7.5 mg weekly - started 07/2021  Also, on insulin pump: - basal rates: 12 AM: 0.75 - ICR: 12 am: 1:12 - target: 130 - ISF: 50 - Active Insulin Time:  4h  TDD from basal insulin:  47-64% >> ~50% >> 49-60% >> ? (Just started back on the pump) TDD from bolus insulin: 36-51% >> ~50% >> 40-51% >> ? (Just started back on the pump) Total daily dose: Up to 60 units a day >> ? - changes infusion site: Every 3 days She had yeast infections in the past on Metformin.  She checks her blood sugars >4 times a day with her CGM:   Previously:   Previously:   Lowest sugar was LO >> 60s >> 80s >> 50s; she has hypoglycemia awareness in the 70s.  No previous hypoglycemia admissions.  She has a glucagon kit at home. Highest sugar was  400s >> 400s >> HI.  No previous DKA admissions.  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   -No CKD: Last BUN/creatinine:  08/08/2021: 10/0.75, GFR 95, ACR 7.4 Lab Results  Component Value Date   BUN 11 01/04/2021   BUN 13 03/19/2019   CREATININE 0.68 01/04/2021   CREATININE 0.64 03/19/2019  10/16/2020: CMP normal with the exception of a glucose of 369.  BUN/creatinine 8/0.76 12/16/2019: CMP normal with BUN/creatinine 9/0.76, GFR 80.  Glucose 168. 09/14/2019: CMP normal with exception of a glucose of 172.  BUN/creatinine 12/0.83, GFR 72. 09/09/2017: 11/0.66, GFR 104, Glu 233, Aphos 124 (39-117) 06/24/2017: 12/0.62, GFR 92, glucose 138. 05/31/2016: 13/0.67, GFR 94. Normal AST and ALT. Glucose 130, ACR unable to calculate (microalbumin <0.7 mg/dL) 07/19/2015: 11/0.68, normal LFTs  -+ HL; last set of lipids: 08/08/2021: 187/102/64/105 10/16/2020: 179/79/69/95 12/16/2019: 173/83/69/89 09/14/2019: 206/77/72/119 Lab Results  Component Value Date   CHOL 259 (H) 03/19/2019   HDL 68.30 03/19/2019   LDLCALC 170 (H) 03/19/2019   TRIG 104.0 03/19/2019   CHOLHDL 4 03/19/2019  09/09/2017: 198/104/67/110 06/26/2017: 189/69/69/107 05/31/2016: 189/68/67/108. 10/04/2015: 195/86/76/102 On Lipitor 10.  - last eye exam was in 09/2020: + Mild DR reportedly.  - she has numbness and tingling in her feet.   B12 level was normal 648 07/19/2015.  Latest TSH was normal: Lab Results  Component Value Date   TSH 1.07 10/31/2020  09/09/2017: TSH 1.18 10/04/2015: TSH 0.780, free T4 0.90  She has a history of hyperkalemia -normalized: She was previously on spironolactone but potassium increased so she had to stop.  Afterwards, she was back on it >> potassium increased to 5.8% 10/16/2020.  After stopping again spironolactone, potassium level returned normal: Lab Results  Component Value Date   K 4.4 01/04/2021   K 5.2 03/19/2019   K 4.8 03/18/2018   K 5.0 05/31/2016   K 5.6 (H)  01/13/2010  10/16/2020: Potassium 5.8%  ROS: + see HPI Neurological: no tremors/+ numbness/+ tingling/no dizziness  I reviewed pt's medications, allergies, PMH, social hx, family hx, and changes were documented in the history of present illness. Otherwise, unchanged from my initial visit note.  PMH: - DM1 - HL - depression   Past Surgical History:  Procedure Laterality Date   ENDOMETRIAL ABLATION     TUBAL LIGATION Bilateral      Social History   Social History   Marital status: Married    Spouse name: N/A   children: yes   Occupational History   Flight attendant   Social History Main Topics   Smoking status: Never Smoker   Smokeless tobacco: Not on file   Alcohol use No   Drug use: No   Current Outpatient Medications on File Prior to Visit  Medication Sig Dispense Refill  atorvastatin (LIPITOR) 10 MG tablet TAKE 1 TABLET BY MOUTH  DAILY 90 tablet 3   Continuous Blood Gluc Sensor (DEXCOM G6 SENSOR) MISC Use as instructed change every 10 days 9 each 3   Continuous Blood Gluc Transmit (DEXCOM G6 TRANSMITTER) MISC 1 Device by Does not apply route every 3 (three) months. 1 each 3   CONTOUR NEXT TEST test strip USE AS INSTRUCTED TO CHECK  BLOOD SUGAR 5 TIMES A DAY 500 strip 12   fluconazole (DIFLUCAN) 150 MG tablet TAKE ONE TAB BY MOUTH SINGLE DOSE (Patient not taking: Reported on 04/05/2021) 1 tablet 1   GLOBAL EASE INJECT PEN NEEDLES 32G X 4 MM MISC USE FOUR times a DAY 200 each 0   Glucagon 3 MG/DOSE POWD Place 3 mg into the nose once as needed for up to 1 dose. 2 each 11   Insulin Disposable Pump (OMNIPOD 5 G6 INTRO, GEN 5,) KIT 1 each by Does not apply route as needed. 1 kit 0   Insulin Disposable Pump (OMNIPOD 5 G6 POD, GEN 5,) MISC 1 each by Does not apply route every 3 (three) days. 30 each 3   insulin glargine (LANTUS SOLOSTAR) 100 UNIT/ML Solostar Pen Inject 20 Units into the skin daily. 15 mL 5   Insulin Lispro-aabc (LYUMJEV) 100 UNIT/ML SOLN Inject 50 Units into  the skin daily. Inject up to 50 units a day in the insulin pump 50 mL 3   Insulin Lispro-aabc, 1 U Dial, (LYUMJEV KWIKPEN) 100 UNIT/ML SOPN Inject under skin up to 30 units a day as advised 15 mL 5   Lancets (ONETOUCH DELICA PLUS CLEXNT70Y) MISC Use up to 6x a day 300 each 3   magnesium oxide (MAG-OX) 400 MG tablet Take 400 mg by mouth daily.     Nutritional Supplements (JUICE PLUS FIBRE PO) Take by mouth.     OVER THE COUNTER MEDICATION      sertraline (ZOLOFT) 100 MG tablet      tirzepatide (MOUNJARO) 7.5 MG/0.5ML Pen Inject 7.5 mg into the skin once a week. 2 mL 2   valACYclovir (VALTREX) 1000 MG tablet      No current facility-administered medications on file prior to visit.   No Known Allergies   FH: - see HPI  PE: BP 124/82 (BP Location: Right Arm, Patient Position: Sitting, Cuff Size: Normal)   Pulse 99   Ht '5\' 2"'  (1.575 m)   Wt 130 lb 12.8 oz (59.3 kg)   SpO2 98%   BMI 23.92 kg/m  Wt Readings from Last 3 Encounters:  09/15/21 130 lb 12.8 oz (59.3 kg)  08/09/21 137 lb (62.1 kg)  04/05/21 156 lb 6.4 oz (70.9 kg)   Constitutional: normal weight, in NAD Eyes: PERRLA, EOMI, no exophthalmos ENT: moist mucous membranes, no thyromegaly, no cervical lymphadenopathy Cardiovascular: tachycardia, RR, No MRG Respiratory: CTA B Gastrointestinal: abdomen soft, NT, ND, BS+ Musculoskeletal: no deformities, strength intact in all 4 Skin: moist, warm, no rashes Neurological: no tremor with outstretched hands, DTR normal in all 4  ASSESSMENT: 1. DM1, uncontrolled, with long-term complications, with hyper and hypoglycemia - + mild DR  2. HL   3.  Overweight  No FH of MTC, no personal h/o pancreatitis.  PLAN:  1. Patient with longstanding, uncontrolled, type 1 diabetes, previously improved when she was on weight watchers, but worsening control after she came off the diet.  Her diabetes control again improved after she opted out of work during the coronavirus pandemic and she  improved her diet and exercise.  However, she had to return to work afterwards and HbA1c worsened.  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  However, since last visit, she restarted on an insulin pump: Now OmniPod 5 with a Dexcom G6 CGM.  At last visit, HbA1c was 8.9%, lower.  Sugars were quite high and decreasing overnight without correction so we discussed about decreasing the dose of Lantus at that time.  We increased the dose in the morning since sugars are high throughout the day.  She was taking a very low amount of insulin with meals and sugars are higher after meals especially after breakfast.  I advised her to take insulin consistently before each meal. -At last visit, she was on Ozempic, which she obtained with difficulty and we changed to [facility] with a coupon card.  She started to see weight loss and improvement in the blood sugars after starting this but was not able to refill it beyond 1 month.  At this visit, we called the pharmacy and it appears that her coupon was run erroneously but they will run it correctly and she will be able to refill it. -She is interested in the [place] which will likely become available towards the end of the year.  At last visit, we discussed about changes in the sensor compared to the previous, G6, version including smaller size, longer life of 14 days, shorter warm-up period of 30 minutes versus 2 hours, and  excellent MARD. -At this visit, she mentions that she was mostly on the insulin injections, and only switched to the OmniPod pump today.  Therefore, the CGM data for the last 2 weeks are actually on basal-bolus insulin regimen. CGM interpretation: -At today's visit, we reviewed her CGM downloads: It appears that he 1.8% of values are in target  range (goal >70%), while 76.5% are higher than 180 (goal <25%), and 1.7% are lower than 70 (goal <4%).  The calculated average blood sugar is 252.  The projected HbA1c for the next 3 months (GMI) is 9.3%. -Reviewing the CGM trends, it appears that her sugars drop significantly during the night and then increase abruptly after breakfast.  Upon questioning, because she is so low in the morning, she is not bolusing for breakfast.  We discussed about importance of correcting the low blood sugar with up to 50 g of carbs, then bolusing for meals and then eat.  It is imperative that if she eats she boluses for the meal, however, it would also be ideal not to have the significant decrease in blood sugars during the night.  Therefore, at this visit, I advised her to decrease the amount of Lantus that she takes at night when she is working and increased the Lantus in the morning.  Also, since her insulin sensitivity improved, I relaxed her insulin sensitivity factor from 50 to 75 -When she is not working and keeps the pump on, will strengthen the ICR before meals since her sugars are higher after these and also relaxer ISF as discussed above -  I advised her to: Patient Instructions  Please change: - Lantus 13 units in am and 5 units at bedtime - Lyumjev:  Insulin to carb ratio: 1:12  Target: 140 Insulin sensitivity factor (correction factor): 75  If you have the pump on: - basal rates: 12 AM: 0.75 - ICR: 12 am: 1:14 >> 12 - target: 130 - ISF: 50 >> 75 - Active Insulin Time: 4h  If your sugars are low before a meal, correct the low with glucose tablets, bolus for the meal and eat.  - advised to check sugars at different times of the day - 4x a day, rotating check times - advised for yearly eye exams >> she is UTD - return to clinic in 3-4 months  2. HL -Reviewed latest lipid panel from 07/2021: LDL only slightly above goal, the rest the fractions at goal -She continues on Lipitor 10 mg daily  without side effects  3.  Overweight -In the past, we tried to start her on Ozempic but this was not covered by her insurance.  A PA was denied.  She was able to get it afterwards with a coupon. -At last visit, we discussed about Mounjaro and she wanted to try it.  I explained that she can use a discount card as insurance.  She did started and started to lose more weight.  However, after a month, the pharmacy was not able to fill it. -At this visit, we called the pharmacy and it appears that there was a mistake on their part and they will be able to fill it.  We will continue the 7.5 mg weekly of Mounjaro.  She already lost 7 pounds on this in the last month.  Total time spent for the visit: 40 min, in reviewing her insulin regimen, discussed about different regimens depending on whether she is flying or not (basal-bolus insulin regimen versus OmniPod pump), troubleshooting why she could not refill Mounjaro and getting in touch with the pharmacy during the visit, reviewing her previous labs by Dr. Marisue Humble obtained since our last visit, discussing her hypo- and hyper-glycemic episodes, reviewing previous labs and pump settings and developing a plan to avoid hypo- and hyper-glycemia.   Philemon Kingdom, MD PhD [facility] Endocrinology

## 2021-09-15 NOTE — Patient Instructions (Addendum)
Please change: - Lantus 13 units in am and 5 units at bedtime - Lyumjev:  Insulin to carb ratio: 1:12  Target: 140 Insulin sensitivity factor (correction factor): 75  If you have the pump on: - basal rates: 12 AM: 0.75 - ICR: 12 am: 1:14 >> 12 - target: 130 - ISF: 50 >> 75 - Active Insulin Time: 4h  If your sugars are low before a meal, correct the low with glucose tablets, bolus for the meal and eat.

## 2021-09-15 NOTE — Telephone Encounter (Signed)
Received a fax regarding Prior Authorization from [employer] for [facility] 7.5MG /0.5ML. Authorization has been DENIED because This medicine is covered only if:  You have a diagnosis of type 2 diabetes mellitus confirmed by accepted laboratory testing methodologies per treatment guidelines (for example, A1C greater than or equal to 6.5%, fasting plasma glucose greater than or equal to 126mg /dL and/or 2-hour plasma glucose greater than or equal to 200mg /dL).

## 2021-09-18 NOTE — Telephone Encounter (Signed)
Contacted pt's pharmacy and was able to rx filled 09/15/21

## 2021-09-18 NOTE — Telephone Encounter (Signed)
Closing encounter

## 2021-09-18 NOTE — Telephone Encounter (Signed)
Pt has filled this medication in pharmacy as of 11.4.22

## 2021-09-18 NOTE — Telephone Encounter (Signed)
Routing back to team for followup

## 2021-10-31 ENCOUNTER — Other Ambulatory Visit: Payer: Self-pay | Admitting: Internal Medicine

## 2021-11-02 ENCOUNTER — Other Ambulatory Visit: Payer: Self-pay | Admitting: Obstetrics & Gynecology

## 2021-11-02 DIAGNOSIS — R928 Other abnormal and inconclusive findings on diagnostic imaging of breast: Secondary | ICD-10-CM

## 2021-11-08 ENCOUNTER — Ambulatory Visit (INDEPENDENT_AMBULATORY_CARE_PROVIDER_SITE_OTHER): Payer: 59 | Admitting: Internal Medicine

## 2021-11-08 ENCOUNTER — Other Ambulatory Visit: Payer: Self-pay

## 2021-11-08 ENCOUNTER — Encounter: Payer: Self-pay | Admitting: Internal Medicine

## 2021-11-08 VITALS — BP 120/78 | HR 80 | Ht 62.0 in | Wt 126.8 lb

## 2021-11-08 DIAGNOSIS — E1065 Type 1 diabetes mellitus with hyperglycemia: Secondary | ICD-10-CM | POA: Diagnosis not present

## 2021-11-08 DIAGNOSIS — E1069 Type 1 diabetes mellitus with other specified complication: Secondary | ICD-10-CM

## 2021-11-08 DIAGNOSIS — E663 Overweight: Secondary | ICD-10-CM | POA: Diagnosis not present

## 2021-11-08 DIAGNOSIS — E785 Hyperlipidemia, unspecified: Secondary | ICD-10-CM | POA: Diagnosis not present

## 2021-11-08 LAB — POCT GLYCOSYLATED HEMOGLOBIN (HGB A1C): Hemoglobin A1C: 8.8 % — AB (ref 4.0–5.6)

## 2021-11-08 MED ORDER — SEMAGLUTIDE (2 MG/DOSE) 8 MG/3ML ~~LOC~~ SOPN: 2.0000 mg | PEN_INJECTOR | SUBCUTANEOUS | 3 refills | Status: DC

## 2021-11-08 NOTE — Patient Instructions (Addendum)
Please use: If you do not have the pump on: - Lantus 14 units - Lyumjev:  Insulin to carb ratio: 1:10 (for high carb meals - try 1:8-9)  Target: 140 Insulin sensitivity factor (correction factor): 75  If you have the pump on: - basal rates: 12 am: 0.65 8 am: 0.85 - ICR: 12 am: 1:10 (for high carb meals - try 1:8-9) (for high carb+ high fat meals - try a dual wave bolus) - target: 130 - ISF: 75 - Active Insulin Time: 4h  If your sugars are low before a meal, correct the low with glucose tablets, bolus for the meal and eat.  Please increase Ozempic to 2 mg weekly.  Please come back for a follow-up appointment in 3 months.

## 2021-11-08 NOTE — Progress Notes (Signed)
Patient ID: Brooke Shah, female   DOB: 09-Jan-1966, 55 y.o.   MRN: [id number]  This visit occurred during the SARS-CoV-2 public health emergency.  Safety protocols were in place, including screening questions prior to the visit, additional usage of staff PPE, and extensive cleaning of exam room while observing appropriate contact time as indicated for disinfecting solutions.   HPI: Brooke Shah is a 55 y.o.-year-old female, returning for follow-up for DM1, dx 2004, uncontrolled, with complications (+ mild DR). Last visit 1.5 months ago.  Interim history: No increased urination, blurry vision, nausea, chest pain. She changes between insulin injections when flying (after experiencing hypoglycemia on the pump) and using the insulin pump when not flying. She was recently told she has low bone density and was suggested to go back to hormone replacement.  She would not want to restart this.  DM1: Insulin pump: -She started to use an insulin pump in 2006. -She had a Revel 523 Medtronic pump since 03/2013. -Afterwards, she was on the [place] since 08/2017 (in warranty until 10/2017 as her old pump broke). She did not like the pump and we tried to change to an Omnipod but this was not approved even after peer to peer review.    -She was finally able to start an OmniPod DASH pump in 2020  -At our visit from 07/2021, she was on basal-bolus insulin regimen -continues to use insulin when flying (she is applied at [facility]) -She started the OmniPod 5 08/2021, but she uses this intermittently-now off  CGM: -She initially had a Dexcom CGM, then a freestyle libre CGM.  She developed an allergy to the adhesive for the [facility] but she then thought it could have been an allergy to her uniform.  However, her insurance stopped covering the CGM and I had to write a letter for her insurance. This did not help and she afterwards found out that the CGM was covered, but this was expensive due to her high  deductible.  However, then she met her deductible and restarted the CGM before last visit.   -Previously off the sensor for almost a month due to lack of supplies  -Now Dexcom G6 -She is interested in the Dexcom G7 CGM  Supplies: - [place]  Insulin: -Previously on Humalog -now on Lyumjev -she did not feel any difference after switching to this  Reviewed HbA1c levels: Lab Results  Component Value Date   HGBA1C 8.9 (A) 08/09/2021   HGBA1C 8.8 (A) 04/05/2021   HGBA1C 8.6 (A) 01/04/2021  10/16/2020: HbA1c 9.2% 06/24/2017: HbA1c 8.9%  05/31/2016: HbA1c 8.6% 01/11/2016: HbA1c 8.7% 09/29/2015: HbA1c 8.9% 09/28/2014: HbA1c 9.8%  When trying: - Lantus 13 units in am and 5 units at bedtime >> actually taking only 10 units in a.m. - Lyumjev:  Insulin to carb ratio: 1:12  Target: 140 Insulin sensitivity factor (correction factor): 75  If she has the pump on: (Currently off the pump) - basal rates: 12 AM: 0.75 - ICR: 12 am: 1:14 >> 12 - target: 130 - ISF: 50 >> 75 - Active Insulin Time: 4h  -Also Ozempic 1 mg weekly >> Mounjaro 7.5 mg weekly - started 07/2021 >> now back on Ozempic 1 mg weekly b/c [employer], television/film/video of [place].  She pays for this out-of-pocket.  She gets Ozempic from a health spa.  TDD from basal insulin:  47-64% >> ~50% >> 49-60% >> ? TDD from bolus insulin: 36-51% >> ~50% >> 40-51% >> ?  Total daily dose: Up to 60 units  a day >> ? - changes infusion site: Every 3 days She had yeast infections in the past on Metformin.  She checks her blood sugars >4 times a day with her CGM:   Previously:   Previously:   Lowest sugar was LO >> 60s >> 80s >> 50s >> 60s; she has hypoglycemia awareness in the 70s.  No previous hypoglycemia admissions.  She has a glucagon kit at home. Highest sugar was 400s >> 400s >> HI >> >400.  No previous DKA admissions.  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   -No CKD: Last BUN/creatinine:  08/08/2021:  10/0.75, GFR 95, ACR 7.4 Lab Results  Component Value Date   BUN 11 01/04/2021   BUN 13 03/19/2019   CREATININE 0.68 01/04/2021   CREATININE 0.64 03/19/2019  10/16/2020: CMP normal with the exception of a glucose of 369.  BUN/creatinine 8/0.76 12/16/2019: CMP normal with BUN/creatinine 9/0.76, GFR 80.  Glucose 168. 09/14/2019: CMP normal with exception of a glucose of 172.  BUN/creatinine 12/0.83, GFR 72. 09/09/2017: 11/0.66, GFR 104, Glu 233, Aphos 124 (39-117) 06/24/2017: 12/0.62, GFR 92, glucose 138. 05/31/2016: 13/0.67, GFR 94. Normal AST and ALT. Glucose 130, ACR unable to calculate (microalbumin <0.7 mg/dL) 07/19/2015: 11/0.68, normal LFTs  -+ HL; last set of lipids: 08/08/2021: 187/102/64/105 10/16/2020: 179/79/69/95 12/16/2019: 173/83/69/89 09/14/2019: 206/77/72/119 Lab Results  Component Value Date   CHOL 259 (H) 03/19/2019   HDL 68.30 03/19/2019   LDLCALC 170 (H) 03/19/2019   TRIG 104.0 03/19/2019   CHOLHDL 4 03/19/2019  09/09/2017: 198/104/67/110 06/26/2017: 189/69/69/107 05/31/2016: 189/68/67/108. 10/04/2015: 195/86/76/102 On Lipitor 10.  - last eye exam was in 09/2020: + Mild DR reportedly.  - she has numbness and tingling in her feet.   B12 level was normal 648 07/19/2015.  Latest TSH was normal: Lab Results  Component Value Date   TSH 1.07 10/31/2020  09/09/2017: TSH 1.18 10/04/2015: TSH 0.780, free T4 0.90  She has a history of hyperkalemia -normalized: She was previously on spironolactone but potassium increased so she had to stop.  Afterwards, she was back on it >> potassium increased to 5.8% 10/16/2020.  After stopping again spironolactone, potassium level returned normal: Lab Results  Component Value Date   K 4.4 01/04/2021   K 5.2 03/19/2019   K 4.8 03/18/2018   K 5.0 05/31/2016   K 5.6 (H) 01/13/2010  10/16/2020: Potassium 5.8  ROS: + see HPI Neurological: no tremors/+ numbness/+ tingling/no dizziness  I reviewed pt's medications, allergies,  PMH, social hx, family hx, and changes were documented in the history of present illness. Otherwise, unchanged from my initial visit note.  PMH: - DM1 - HL - depression   Past Surgical History:  Procedure Laterality Date   ENDOMETRIAL ABLATION     TUBAL LIGATION Bilateral      Social History   Social History   Marital status: Married    Spouse name: N/A   children: yes   Occupational History   Flight attendant   Social History Main Topics   Smoking status: Never Smoker   Smokeless tobacco: Not on file   Alcohol use No   Drug use: No   Current Outpatient Medications on File Prior to Visit  Medication Sig Dispense Refill   atorvastatin (LIPITOR) 10 MG tablet TAKE 1 TABLET BY MOUTH  DAILY 90 tablet 3   Continuous Blood Gluc Sensor (DEXCOM G6 SENSOR) MISC Use as instructed change every 10 days 9 each 3   Continuous  Blood Gluc Transmit (DEXCOM G6 TRANSMITTER) MISC 1 Device by Does not apply route every 3 (three) months. 1 each 3   CONTOUR NEXT TEST test strip USE AS INSTRUCTED TO CHECK  BLOOD SUGAR 5 TIMES A DAY 500 strip 12   fluconazole (DIFLUCAN) 150 MG tablet TAKE ONE TAB BY MOUTH SINGLE DOSE (Patient not taking: Reported on 04/05/2021) 1 tablet 1   GLOBAL EASE INJECT PEN NEEDLES 32G X 4 MM MISC USE FOUR times a DAY 200 each 1   Glucagon 3 MG/DOSE POWD Place 3 mg into the nose once as needed for up to 1 dose. 2 each 11   Insulin Disposable Pump (OMNIPOD 5 G6 INTRO, GEN 5,) KIT 1 each by Does not apply route as needed. 1 kit 0   Insulin Disposable Pump (OMNIPOD 5 G6 POD, GEN 5,) MISC 1 each by Does not apply route every 3 (three) days. 30 each 3   insulin glargine (LANTUS SOLOSTAR) 100 UNIT/ML Solostar Pen Inject 20 Units into the skin daily. 15 mL 5   Insulin Lispro-aabc (LYUMJEV) 100 UNIT/ML SOLN Inject 50 Units into the skin daily. Inject up to 50 units a day in the insulin pump 50 mL 3   Insulin Lispro-aabc, 1 U Dial, (LYUMJEV KWIKPEN) 100 UNIT/ML SOPN Inject under skin up  to 30 units a day as advised 15 mL 5   Lancets (ONETOUCH DELICA PLUS TIRWER15Q) MISC Use up to 6x a day 300 each 3   magnesium oxide (MAG-OX) 400 MG tablet Take 400 mg by mouth daily.     Nutritional Supplements (JUICE PLUS FIBRE PO) Take by mouth.     OVER THE COUNTER MEDICATION      sertraline (ZOLOFT) 100 MG tablet      tirzepatide (MOUNJARO) 7.5 MG/0.5ML Pen Inject 7.5 mg into the skin once a week. 2 mL 5   valACYclovir (VALTREX) 1000 MG tablet      No current facility-administered medications on file prior to visit.   No Known Allergies   FH: - see HPI  PE: BP 120/78 (BP Location: Right Arm, Patient Position: Sitting, Cuff Size: Normal)    Pulse 80    Ht '5\' 2"'  (1.575 m)    Wt 126 lb 12.8 oz (57.5 kg)    SpO2 97%    BMI 23.19 kg/m  Wt Readings from Last 3 Encounters:  11/08/21 126 lb 12.8 oz (57.5 kg)  09/15/21 130 lb 12.8 oz (59.3 kg)  08/09/21 137 lb (62.1 kg)   Constitutional: normal weight, in NAD Eyes: PERRLA, EOMI, no exophthalmos ENT: moist mucous membranes, no thyromegaly, no cervical lymphadenopathy Cardiovascular: tachycardia, RR, No MRG Respiratory: CTA B Musculoskeletal: no deformities, strength intact in all 4 Skin: moist, warm, no rashes Neurological: no tremor with outstretched hands, DTR normal in all 4  ASSESSMENT: 1. DM1, uncontrolled, with long-term complications, with hyper and hypoglycemia - + mild DR  2. HL   3.  History of overweight  No FH of MTC, no personal h/o pancreatitis.  PLAN:  1. Patient with uncontrolled, type 1 diabetes, previously improved when she was on weight watchers, but worsening control after she came off the diet.  Her sugar control again improved after she opted out of work during the coronavirus pandemic and improved her diet and exercise.,  She had to return to work and blood sugars worsened.  Before last visit, she was able to switch to a new pump: OmniPod 5, integrated with the Dexcom G6 CGM.  HbA1c initially  improved to  8.9%, but before last visit, and HbA1c was 9.8% (08/09/2021). -In an effort to help her blood sugar control and also her weight, we initially started Ozempic and then switched to [facility], currently on 7.5 mg weekly.  She lost weight on this and feeling much better.  Sugars also improved.  At last visit, sugars were dropping significantly during the night and increasing abruptly after breakfast.  Upon questioning, because her sugars were so low in the morning, she was not bolusing for breakfast.  We discussed about the importance of not correcting blood sugars first, then bolusing for meal and then each.  We did decrease the amount of Lantus that she was using at night when she was working to avoid low blood sugars overnight and we also relaxed her insulin to get a good factor.  When she had her pump, I advised her to strengthen the ICR before meals since her sugars are high postprandially. -In the past, she had a low blood sugar episode while flying (she felt lightheaded and and since then she is using the basal-bolus insulin regimen during her shifts and is on insulin pump when not working. CGM interpretation: -At today's visit, we reviewed her CGM downloads: It appears that 20.5% of values are in target range (goal >70%), while 79% are higher than 180 (goal <25%), and 0.6% are lower than 70 (goal <4%).  The calculated average blood sugar is 260.  The projected HbA1c for the next 3 months (GMI) is 9.5%. -Reviewing the CGM trends, sugars are very poorly controlled, with the vast majority of them above our target range.  They do decrease overnight especially when she was taking Lantus at bedtime, but even after she stopped the evening dose.  Moreover, the decrease in blood sugars overnight appears to be happening even when she is on the pump.  At this visit, I advised her to increase her Lantus in the morning but I did not suggest to add back a second dose at bedtime.  Also, when she is on the pump, we decreased  her basal rate throughout the night and we increased the basal rate throughout the day. -Sugars appear to be increasing after every meal, mostly after breakfast and lunch and they are more variable after dinner, but still elevated.  At this visit, we discussed about how to judge whether the insulin that she takes before being is the patient.  I advised her that if her blood sugars increase by 30 to 50 mg/dL consistently after meals, continues to strengthen insulin to carb ratio without meal.  For now, I advised him to strengthen insulin to carb ratios with all meals from 1: 12-1: 10.  Moreover, when she is eating a high carb meal, I have residues and even lower insulin to carb ratio, blood 1: 8-9.  For now, I advised him to continue the same targets and insulin sensitivity factors.  We also discussed about situations in which she would need to do well dual wave bolus.  I explained that these meals contain carbs but also fats: For example pizza, [place] food, etc.  In these situations, I advised him to start by splitting the bolus 50% / 50% and extend the second bolus over 2 hours.  She can then adjust these parameters if needed. -We discussed about the Dexcom G7, which was approved by the FDA, but it is not available in pharmacies yet. -For now, we also discussed about increasing her Ozempic from 1 mg to 2 mg,  since she is tolerating it well.  Unfortunately, she could not get Mounjaro anymore.  When back on the market, we can return to this. -For now I advised her to return to the insulin pump since she is only starting working after the holidays, in more than a week. -  I advised her to: Patient Instructions  Please use: If you do not have the pump on: - Lantus 14 units - Lyumjev:  Insulin to carb ratio: 1:10 (for high carb meals - try 1:8-9)  Target: 140 Insulin sensitivity factor (correction factor): 75  If you have the pump on: - basal rates: 12 am: 0.65 8 am: 0.85 - ICR: 12 am: 1:10 (for  high carb meals - try 1:8-9) (for high carb+ high fat meals - try a dual wave bolus) - target: 130 - ISF: 75 - Active Insulin Time: 4h  If your sugars are low before a meal, correct the low with glucose tablets, bolus for the meal and eat.  Please increase Ozempic to 2 mg weekly.  Please come back for a follow-up appointment in 3 months.  - advised to check sugars at different times of the day - 4x a day, rotating check times - advised for yearly eye exams >> she is not UTD - return to clinic in 3 months  2. HL -Reviewed latest lipid panel from 07/2021: LDL only slightly above goal, the rest the fractions at goal -She continues on Lipitor 10 mg daily without side effects  3.  History of overweight -Now in the normal weight category based on BMI -In the past, we tried to start her on Ozempic but this was not covered by her insurance.  A PA was denied.  She was unable to get it with a coupon.   -However, afterwards, we were able to start Mounjaro 7.5 mg weekly.  She could not find this since last visit due to being on backorder so she is back on Ozempic 1 mg weekly.  We will increase the dose (see above). -At last visit, sugars were better and she lost 7 pounds on this.  At this visit, she lost 4 more pounds.  Total time spent for the visit: 40 min, in reviewing her pump downloads, discussing her hypo- and hyper-glycemic episodes, reviewing previous labs and pump settings and developing a plan to avoid hypo- and hyper-glycemia.    Philemon Kingdom, MD PhD [facility]

## 2021-11-20 ENCOUNTER — Ambulatory Visit: Payer: 59

## 2021-11-20 ENCOUNTER — Ambulatory Visit
Admission: RE | Admit: 2021-11-20 | Discharge: 2021-11-20 | Disposition: A | Discharge status: Home / Self Care | Payer: 59 | Source: Ambulatory Visit | Attending: Obstetrics & Gynecology | Admitting: Obstetrics & Gynecology

## 2021-11-20 DIAGNOSIS — R928 Other abnormal and inconclusive findings on diagnostic imaging of breast: Secondary | ICD-10-CM

## 2021-12-07 ENCOUNTER — Other Ambulatory Visit: Payer: 59

## 2021-12-21 ENCOUNTER — Encounter: Payer: Self-pay | Admitting: Internal Medicine

## 2021-12-21 ENCOUNTER — Other Ambulatory Visit (HOSPITAL_COMMUNITY): Payer: Self-pay

## 2021-12-21 ENCOUNTER — Telehealth: Payer: Self-pay

## 2021-12-21 NOTE — Telephone Encounter (Signed)
Patient Advocate Encounter   Received notification from [facility] that prior authorization for Mounjaro 7.5mg /0.71ml pen injectors is required by his/her insurance [employer]   PA submitted on 12/21/21  Key#: [id number]  Status is pending    [facility] will continue to follow:  Patient Advocate Fax: [phone]

## 2021-12-22 ENCOUNTER — Other Ambulatory Visit (HOSPITAL_COMMUNITY): Payer: Self-pay

## 2021-12-22 NOTE — Telephone Encounter (Signed)
Patient Advocate Encounter  Received notification from [place] that the request for prior authorization for Brooke Shah has been denied due to the patient having Type 1 diabetes.     This determination is currently being appealed.  The appeal was faxed to [facility].   This encounter will continue to be updated until final determination.     Specialty Pharmacy Patient Advocate Fax: [phone]

## 2021-12-25 NOTE — Telephone Encounter (Signed)
Patient Advocate Encounter  Received notification from [employer] that the request for prior authorization appeal for Brooke Shah has been denied due to the drug not being medically necessary.    Specialty Pharmacy Patient Advocate Fax: [phone]

## 2021-12-26 ENCOUNTER — Other Ambulatory Visit (HOSPITAL_COMMUNITY): Payer: Self-pay

## 2021-12-26 ENCOUNTER — Telehealth: Payer: Self-pay

## 2021-12-26 NOTE — Telephone Encounter (Signed)
Patient Advocate Encounter   Received notification from patient calls that prior authorization for Ozempic 8mg /2ml pen injectors is required by his/her insurance [employer].   PA submitted on 12/26/21  Key#: [id number]  Status is pending    [facility] will continue to follow:  Patient Advocate Fax: [phone]

## 2021-12-26 NOTE — Telephone Encounter (Signed)
Can we use the Ozempic 2 mg as per last note?

## 2022-01-12 ENCOUNTER — Encounter: Payer: Self-pay | Admitting: Internal Medicine

## 2022-01-12 NOTE — Telephone Encounter (Signed)
She is now linked to [facility]  !!

## 2022-01-12 NOTE — Telephone Encounter (Signed)
Report printed and given to provider. ?

## 2022-01-18 ENCOUNTER — Other Ambulatory Visit: Payer: Self-pay | Admitting: Internal Medicine

## 2022-01-18 MED ORDER — TIRZEPATIDE 7.5 MG/0.5ML ~~LOC~~ SOAJ: 7.5000 mg | SUBCUTANEOUS | 5 refills | Status: DC

## 2022-01-22 ENCOUNTER — Other Ambulatory Visit (HOSPITAL_COMMUNITY): Payer: Self-pay

## 2022-01-22 NOTE — Telephone Encounter (Signed)
Sure, we can try. ?Thank you! ?CG ?

## 2022-02-13 ENCOUNTER — Other Ambulatory Visit: Payer: Self-pay | Admitting: Internal Medicine

## 2022-02-14 ENCOUNTER — Ambulatory Visit: Payer: 59 | Admitting: Internal Medicine

## 2022-02-18 ENCOUNTER — Other Ambulatory Visit: Payer: Self-pay | Admitting: Internal Medicine

## 2022-02-27 ENCOUNTER — Other Ambulatory Visit: Payer: Self-pay | Admitting: Internal Medicine

## 2022-02-28 ENCOUNTER — Telehealth: Payer: Self-pay

## 2022-02-28 NOTE — Telephone Encounter (Signed)
Pharmacy called requesting clarification on Lyumjev kwikpen rx sent in. ?

## 2022-03-08 ENCOUNTER — Telehealth: Payer: Self-pay

## 2022-03-08 NOTE — Telephone Encounter (Signed)
Inbound fax from DME supplier requesting form be completed and faxed with clinical notes. DME supplies ordered via [employer] through online portal.  

## 2022-03-26 ENCOUNTER — Encounter: Payer: Self-pay | Admitting: Internal Medicine

## 2022-03-26 ENCOUNTER — Ambulatory Visit (INDEPENDENT_AMBULATORY_CARE_PROVIDER_SITE_OTHER): Payer: Commercial Managed Care - PPO | Admitting: Internal Medicine

## 2022-03-26 VITALS — BP 126/76 | HR 94 | Ht 62.0 in | Wt 125.6 lb

## 2022-03-26 DIAGNOSIS — E1069 Type 1 diabetes mellitus with other specified complication: Secondary | ICD-10-CM | POA: Diagnosis not present

## 2022-03-26 DIAGNOSIS — E875 Hyperkalemia: Secondary | ICD-10-CM | POA: Diagnosis not present

## 2022-03-26 DIAGNOSIS — E785 Hyperlipidemia, unspecified: Secondary | ICD-10-CM

## 2022-03-26 DIAGNOSIS — E663 Overweight: Secondary | ICD-10-CM | POA: Diagnosis not present

## 2022-03-26 DIAGNOSIS — E1065 Type 1 diabetes mellitus with hyperglycemia: Secondary | ICD-10-CM | POA: Diagnosis not present

## 2022-03-26 LAB — POCT GLYCOSYLATED HEMOGLOBIN (HGB A1C): Hemoglobin A1C: 9 % — AB (ref 4.0–5.6)

## 2022-03-26 LAB — TSH: TSH: 1.04 u[IU]/mL (ref 0.35–5.50)

## 2022-03-26 MED ORDER — DEXCOM G6 TRANSMITTER MISC: 1.0000 | 3 refills | Status: DC

## 2022-03-26 MED ORDER — DEXCOM G6 SENSOR MISC: 3 refills | Status: DC

## 2022-03-26 NOTE — Progress Notes (Signed)
Patient ID: Brooke Shah, female   DOB: 05-Jul-1966, 56 y.o.   MRN: [id number] ? ?This visit occurred during the SARS-CoV-2 public health emergency.  Safety protocols were in place, including screening questions prior to the visit, additional usage of staff PPE, and extensive cleaning of exam room while observing appropriate contact time as indicated for disinfecting solutions.  ? ?HPI: ?Brooke Shah is a 56 y.o.-year-old female, returning for follow-up for DM1, dx 2004, uncontrolled, with complications (+ mild DR). Last visit 4 months ago. ? ?Interim history: ?No increased urination, blurry vision, nausea, chest pain. ?She changes between insulin injections when flying (after experiencing hypoglycemia on the pump) and using the insulin pump when not flying. ?Her insurance did not approve Mounjaro, but she obtains it from a health. ? ?DM1: ?Insulin pump: ?-She started to use an insulin pump in 2006. ?-She had a Revel 523 Medtronic pump since 03/2013. ?-Afterwards, she was on the [place] since 08/2017 (in warranty until 10/2017 as her old pump broke). She did not like the pump and we tried to change to an Omnipod but this was not approved even after peer to peer review.    ?-She was finally able to start an OmniPod DASH pump in 2020  ?-At our visit from 07/2021, she was on basal-bolus insulin regimen -continues to use insulin when flying (she is applied at [facility]) ?-She started the OmniPod 5 08/2021, but she uses this intermittently -changes from pump to injections approximately once a week ? ?CGM: ?-She initially had a Dexcom CGM, then a freestyle libre CGM.  She developed an allergy to the adhesive for the [facility] but she then thought it could have been an allergy to her uniform.  However, her insurance stopped covering the CGM and I had to write a letter for her insurance. This did not help and she afterwards found out that the CGM was covered, but this was expensive due to her high deductible.   However, then she met her deductible and restarted the CGM before last visit.   ?-Previously off the sensor for almost a month due to lack of supplies  ?-Now Dexcom G6 ?-She is interested in the Dexcom G7 CGM ? ?Supplies: ?- [employer] ? ?Insulin: ?-Previously on Humalog ?-now on Lyumjev -she did not feel any difference after switching to this ? ?Reviewed HbA1c levels: ?Lab Results  ?Component Value Date  ? HGBA1C 8.8 (A) 11/08/2021  ? HGBA1C 8.9 (A) 08/09/2021  ? HGBA1C 8.8 (A) 04/05/2021  ?10/16/2020: HbA1c 9.2% ?06/24/2017: HbA1c 8.9%  ?05/31/2016: HbA1c 8.6% ?01/11/2016: HbA1c 8.7% ?09/29/2015: HbA1c 8.9% ?09/28/2014: HbA1c 9.8% ? ?If you do not have the pump on: ?- Lantus 14 units ?- Lyumjev:  ?Insulin to carb ratio: - but actually using 1:13-15 ?Target: 140 >> but using 120 ?Insulin sensitivity factor (correction factor): 75 >> but using 70 ? ?If you have the pump on: ?- basal rates: ?12 am: >> but using 0.45 ?8 am:  >> but using  0.45 ?- ICR: ?12 am:- but actually using 1:15 ?- target: 130 >> but actually using 1:13-15 ?- ISF: 75 >> but using 70 ?- Active Insulin Time: 4h ? ?-Also Ozempic 1 mg weekly >> Mounjaro 7.5 mg weekly >> stopped >> back on Mounjaro 7.5 mg weekly (from a health spa). ? ?TDD from basal insulin:  47-64% >> ~50% >> 49-60% >> 51% (80 units) ?TDD from bolus insulin: 36-51% >> ~50% >> 40-51% >> 49% (7.5 units) ?Total daily dose: Up to 60 units  a day >> 16-30 units ?- changes infusion site: Every 3 days ?She had yeast infections in the past on Metformin. ? ?She checks her blood sugars >4 times a day with her CGM: ? ? ?Prev: ? ? ?Previously: ? ? ?Lowest sugar was 50s >> 60s >> 49; she has hypoglycemia awareness in the 70s.  No previous hypoglycemia admissions.  She has a glucagon kit at home. ?Highest sugar was HI >> >400 >> 441.  No previous DKA admissions. ? 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  ? ?-No CKD: Last BUN/creatinine:  ?08/08/2021: 10/0.75, GFR 95, ACR  7.4 ?Lab Results  ?Component Value Date  ? BUN 11 01/04/2021  ? BUN 13 03/19/2019  ? CREATININE 0.68 01/04/2021  ? CREATININE 0.64 03/19/2019  ?10/16/2020: CMP normal with the exception of a glucose of 369.  BUN/creatinine 8/0.76 ?12/16/2019: CMP normal with BUN/creatinine 9/0.76, GFR 80.  Glucose 168. ?09/14/2019: CMP normal with exception of a glucose of 172.  BUN/creatinine 12/0.83, GFR 72. ?09/09/2017: 11/0.66, GFR 104, Glu 233, Aphos 124 (39-117) ?06/24/2017: 12/0.62, GFR 92, glucose 138. ?05/31/2016: 13/0.67, GFR 94. Normal AST and ALT. Glucose 130, ACR unable to calculate (microalbumin <0.7 mg/dL) ?07/19/2015: 11/0.68, normal LFTs ? ?-+ HL; last set of lipids: ?08/08/2021: 187/102/64/105 ?10/16/2020: 179/79/69/95 ?12/16/2019: 173/83/69/89 ?09/14/2019: 206/77/72/119 ?Lab Results  ?Component Value Date  ? CHOL 259 (H) 03/19/2019  ? HDL 68.30 03/19/2019  ? LDLCALC 170 (H) 03/19/2019  ? TRIG 104.0 03/19/2019  ? CHOLHDL 4 03/19/2019  ?09/09/2017: 198/104/67/110 ?06/26/2017: 189/69/69/107 ?05/31/2016: 189/68/67/108. ?10/04/2015: 195/86/76/102 ?On Lipitor 10. ? ?- last eye exam was 01/23/2022: + Mild DR reportedly. ? ?- she has numbness and tingling in her feet.  ? ?B12 level was normal 648 - 07/19/2015. ? ?Latest TSH was normal: ?Lab Results  ?Component Value Date  ? TSH 1.07 10/31/2020  ?09/09/2017: TSH 1.18 ?10/04/2015: TSH 0.780, free T4 0.90 ? ?She has a history of hyperkalemia -normalized: ?She was previously on spironolactone but potassium increased so she had to stop.  Afterwards, she was back on it >> potassium increased to 5.8% 10/16/2020.  After stopping again spironolactone, potassium level returned normal: ?Lab Results  ?Component Value Date  ? K 4.4 01/04/2021  ? K 5.2 03/19/2019  ? K 4.8 03/18/2018  ? K 5.0 05/31/2016  ? K 5.6 (H) 01/13/2010  ?10/16/2020: Potassium 5.8 ? ?She has low bone density and was suggested to go back to hormone replacement.  She would not want to restart this. ? ?ROS: ?+ see  HPI ?Neurological: no tremors/+ numbness/+ tingling/no dizziness ? ?I reviewed pt's medications, allergies, PMH, social hx, family hx, and changes were documented in the history of present illness. Otherwise, unchanged from my initial visit note. ? ?PMH: ?- DM1 ?- HL ?- depression  ? ?Past Surgical History:  ?Procedure Laterality Date  ? ENDOMETRIAL ABLATION    ? TUBAL LIGATION Bilateral   ?  ? ?Social History  ? ?Social History  ? Marital status: Married  ?  Spouse name: N/A  ? children: yes  ? 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 ?Social History Main Topics  ? Smoking status: Never Smoker  ? Smokeless tobacco: Not on file  ? Alcohol use No  ? Drug use: No  ? ?Current Outpatient Medications on File Prior to Visit  ?Medication Sig Dispense Refill  ? atorvastatin (LIPITOR) 10 MG tablet TAKE 1 TABLET BY MOUTH  DAILY 90 tablet 3  ? Continuous Blood Gluc  Sensor (DEXCOM G6 SENSOR) MISC Use as instructed change every 10 days 9 each 3  ? Continuous Blood Gluc Transmit (DEXCOM G6 TRANSMITTER) MISC 1 Device by Does not apply route every 3 (three) months. 1 each 3  ? CONTOUR NEXT TEST test strip USE AS INSTRUCTED TO CHECK  BLOOD SUGAR 5 TIMES A DAY 500 strip 12  ? fluconazole (DIFLUCAN) 150 MG tablet TAKE ONE TAB BY MOUTH SINGLE DOSE 1 tablet 1  ? GLOBAL EASE INJECT PEN NEEDLES 32G X 4 MM MISC USE FOUR times a DAY 200 each 1  ? Glucagon 3 MG/DOSE POWD Place 3 mg into the nose once as needed for up to 1 dose. 2 each 11  ? Insulin Disposable Pump (OMNIPOD 5 G6 INTRO, GEN 5,) KIT 1 each by Does not apply route as needed. 1 kit 0  ? Insulin Disposable Pump (OMNIPOD 5 G6 POD, GEN 5,) MISC 1 each by Does not apply route every 3 (three) days. 30 each 3  ? Insulin Lispro-aabc (LYUMJEV KWIKPEN) 100 UNIT/ML KwikPen inject UNDER SKIN UP TO 30 units a DAY AS DIRECTED 15 mL 0  ? Insulin Lispro-aabc (LYUMJEV) 100 UNIT/ML SOLN Inject 50 Units into the skin daily. Inject up to 50 units a day in the insulin pump 50 mL 3  ? Lancets  (ONETOUCH DELICA PLUS [id number]) MISC Use up to 6x a day 300 each 3  ? LANTUS SOLOSTAR 100 UNIT/ML Solostar Pen inject 20 units into THE SKIN DAILY 15 mL 5  ? magnesium oxide (MAG-OX) 400 MG tablet Take 400 mg by mouth daily.

## 2022-03-26 NOTE — Patient Instructions (Addendum)
Please use: ?If you do not have the pump on: ?- Lantus 14 units ?- Lyumjev:  ?Insulin to carb ratio: 1:10 (for high carb meals - try 1:8-9)  ?Target: 120 ?Insulin sensitivity factor (correction factor): 70 ?  ?If you have the pump on: ?- basal rates: ?12 am: 0.45 >>  0.65 ?8 am: 0.45 >> 0.85 ?- ICR: ?12 am: 1:10  (for high carb+ high fat meals - try a dual wave bolus) ?- target: 120 ?- ISF: 70 ?- Active Insulin Time: 4h ?  ?If your sugars are low before a meal, correct the low with glucose tablets, bolus for the meal and eat. ?  ?Please continue Mounjaro 7.5 mg weekly. ?  ?Please come back for a follow-up appointment in 3 months. ?

## 2022-03-27 ENCOUNTER — Encounter: Payer: Self-pay | Admitting: Internal Medicine

## 2022-03-27 LAB — MICROALBUMIN / CREATININE URINE RATIO
Creatinine,U: 132.5 mg/dL
Microalb Creat Ratio: 0.5 mg/g (ref 0.0–30.0)
Microalb, Ur: 0.7 mg/dL (ref 0.0–1.9)

## 2022-03-27 LAB — LIPID PANEL
Cholesterol: 171 mg/dL (ref 0–200)
HDL: 60.8 mg/dL (ref >39.00)
LDL Cholesterol: 82 mg/dL (ref 0–99)
NonHDL: 110.38
Total CHOL/HDL Ratio: 3
Triglycerides: 143 mg/dL (ref 0.0–149.0)
VLDL: 28.6 mg/dL (ref 0.0–40.0)

## 2022-03-27 LAB — COMPREHENSIVE METABOLIC PANEL
ALT: 18 U/L (ref 0–35)
AST: 18 U/L (ref 0–37)
Albumin: 4.1 g/dL (ref 3.5–5.2)
Alkaline Phosphatase: 103 U/L (ref 39–117)
BUN: 16 mg/dL (ref 6–23)
CO2: 28 mEq/L (ref 19–32)
Calcium: 9.5 mg/dL (ref 8.4–10.5)
Chloride: 102 mEq/L (ref 96–112)
Creatinine, Ser: 0.7 mg/dL (ref 0.40–1.20)
GFR: 97.97 mL/min (ref >60.00)
Glucose, Bld: 222 mg/dL — ABNORMAL HIGH (ref 70–99)
Potassium: 5.6 mEq/L — ABNORMAL HIGH (ref 3.5–5.1)
Sodium: 138 mEq/L (ref 135–145)
Total Bilirubin: 0.4 mg/dL (ref 0.2–1.2)
Total Protein: 6.3 g/dL (ref 6.0–8.3)

## 2022-04-06 ENCOUNTER — Other Ambulatory Visit (INDEPENDENT_AMBULATORY_CARE_PROVIDER_SITE_OTHER): Payer: Commercial Managed Care - PPO

## 2022-04-06 DIAGNOSIS — E875 Hyperkalemia: Secondary | ICD-10-CM | POA: Diagnosis not present

## 2022-04-06 LAB — BASIC METABOLIC PANEL
BUN: 13 mg/dL (ref 6–23)
CO2: 32 mEq/L (ref 19–32)
Calcium: 9.5 mg/dL (ref 8.4–10.5)
Chloride: 100 mEq/L (ref 96–112)
Creatinine, Ser: 0.64 mg/dL (ref 0.40–1.20)
GFR: 100.08 mL/min (ref >60.00)
Glucose, Bld: 181 mg/dL — ABNORMAL HIGH (ref 70–99)
Potassium: 5 mEq/L (ref 3.5–5.1)
Sodium: 139 mEq/L (ref 135–145)

## 2022-04-29 ENCOUNTER — Other Ambulatory Visit: Payer: Self-pay | Admitting: Internal Medicine

## 2022-05-08 IMAGING — MG MM DIGITAL DIAGNOSTIC UNILAT*L* W/ TOMO W/ CAD
4 series · 4 of 12 positions shown · non-contrast
Comparison: Previous exams including recent screening mammogram
dated 11/01/2021.

CLINICAL DATA: Patient returns today to evaluate a possible LEFT
breast asymmetry questioned on recent screening mammogram.

EXAM:
DIGITAL DIAGNOSTIC UNILATERAL LEFT MAMMOGRAM WITH TOMOSYNTHESIS AND
CAD
TECHNIQUE: Left digital diagnostic mammography and breast tomosynthesis was
performed. The images were evaluated with computer-aided detection.

[L MLO synth-2D]
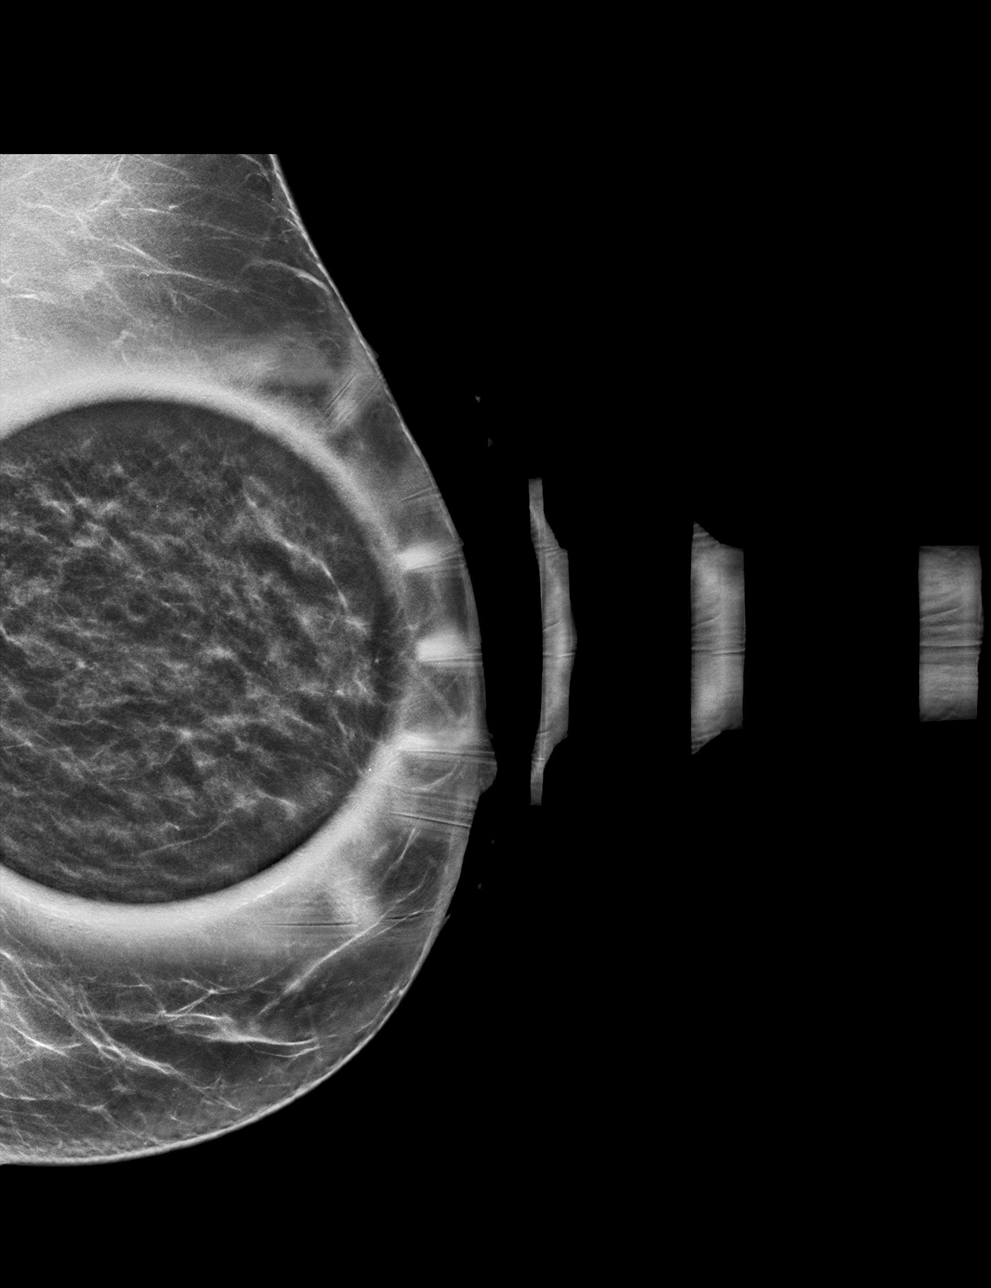

[L ML synth-2D]
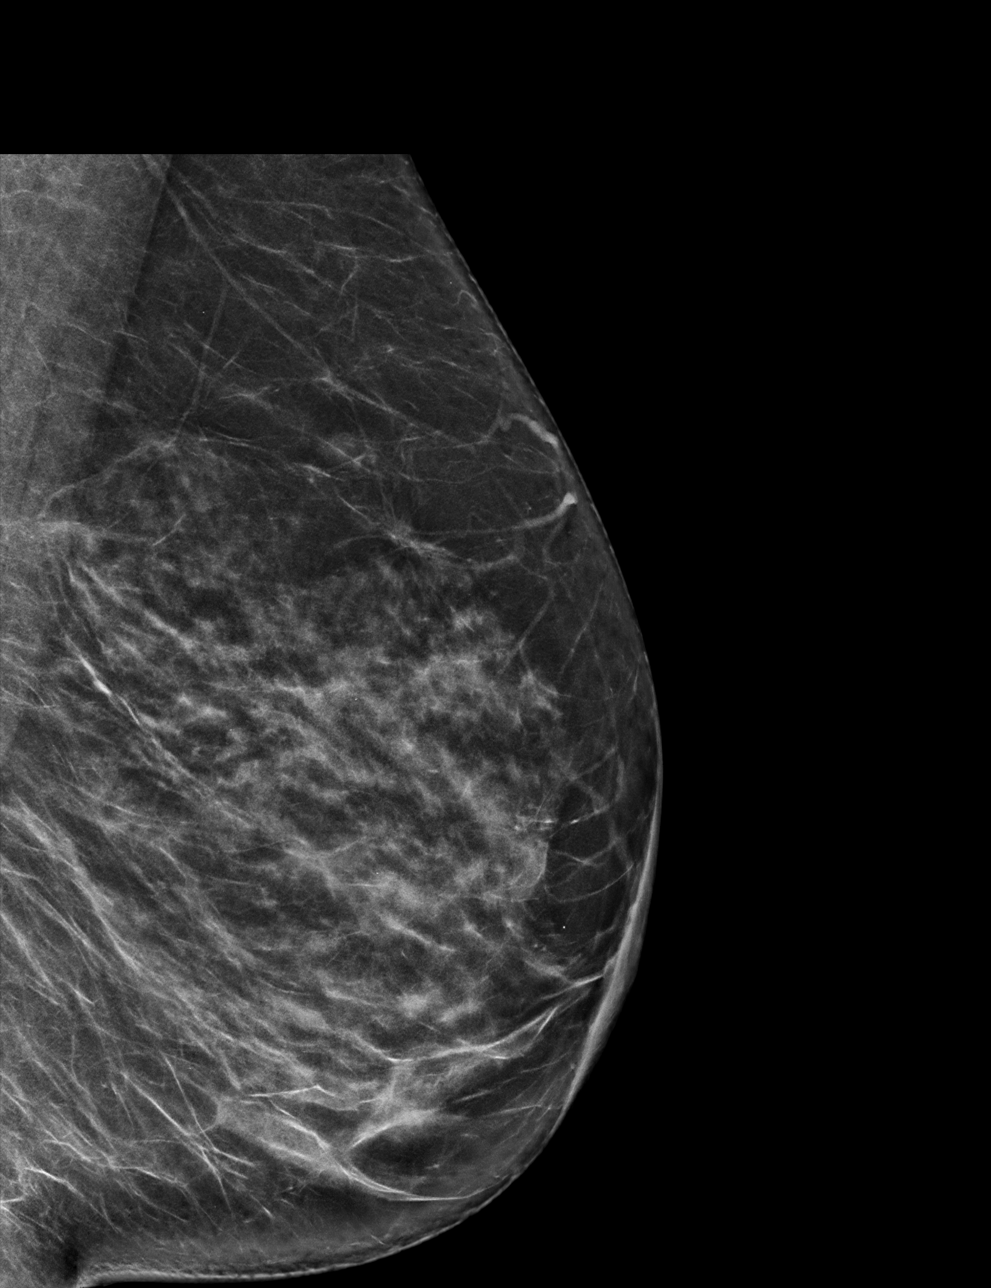

[L MLO tomo · tomo slice 29/56.0]
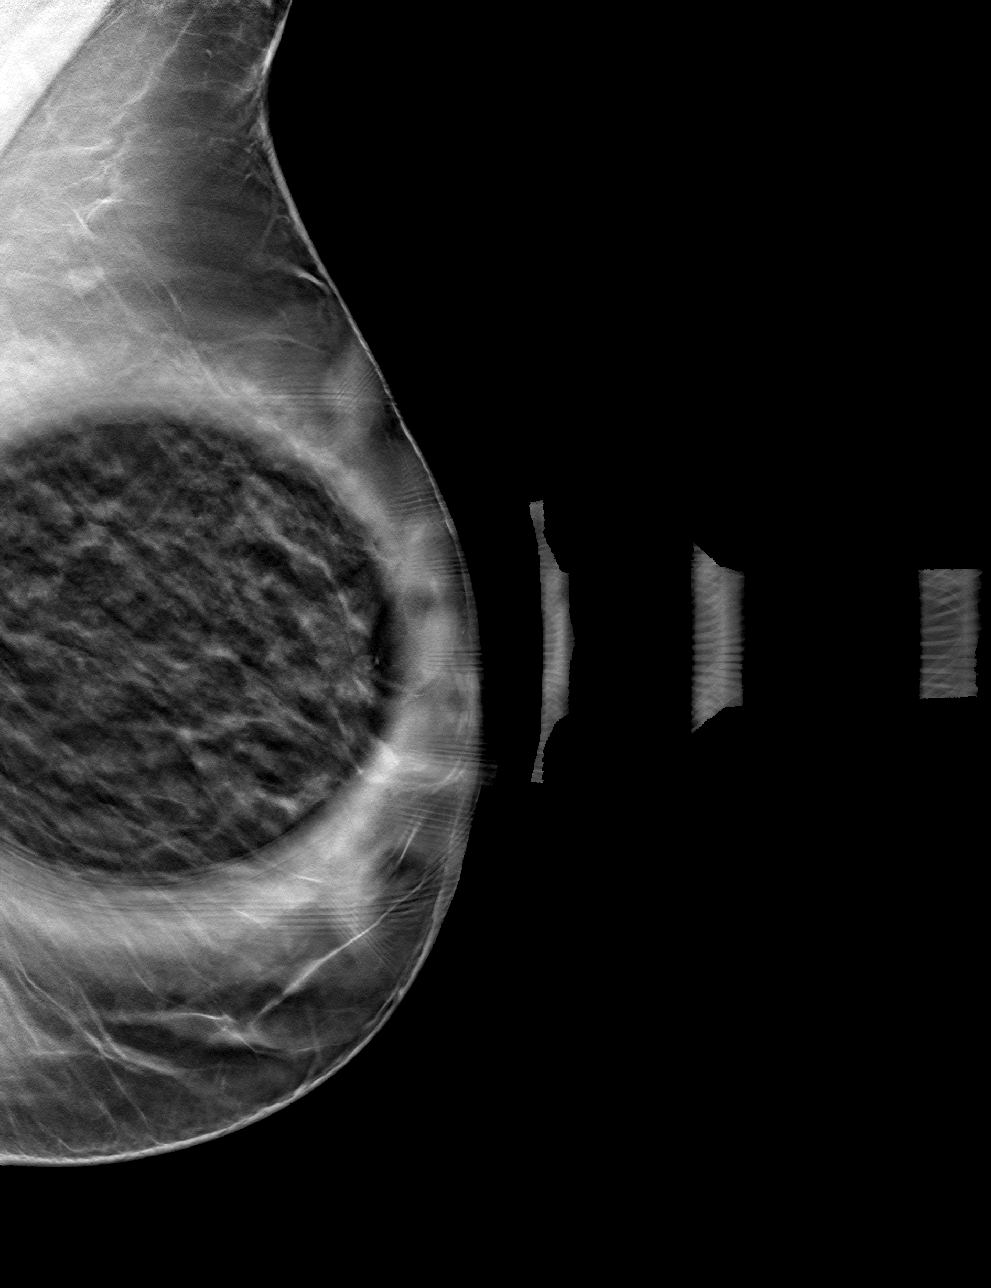

[L ML tomo · tomo slice 34/67.0]
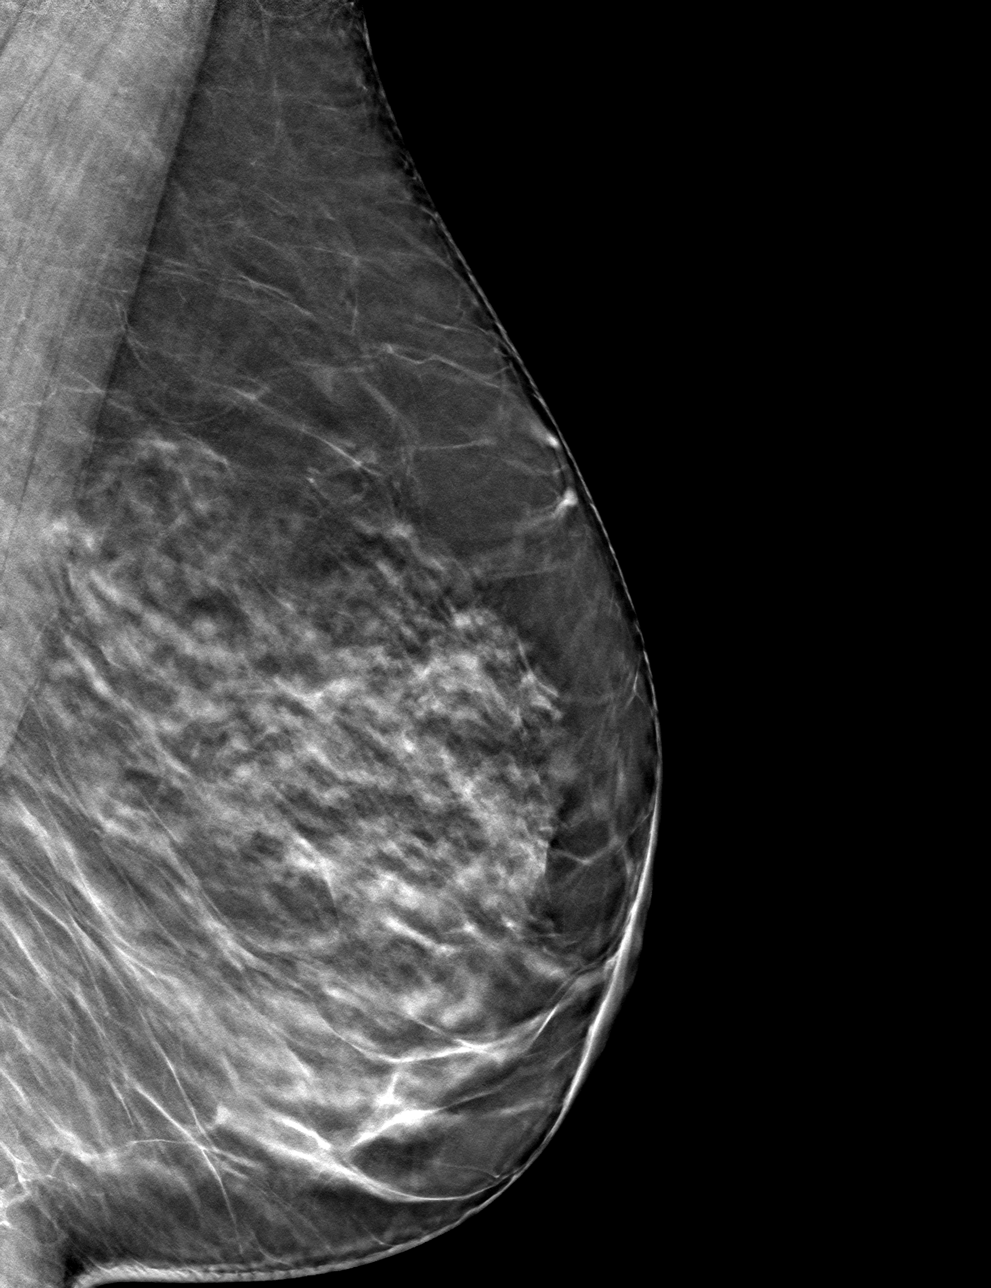

[4 of 12 positions shown; findings below may reference images not displayed]

ACR Breast Density Category c: The breast tissue is heterogeneously
dense, which may obscure small masses.
FINDINGS: On today's additional diagnostic views, including spot compression
with 3D tomosynthesis, there is no persistent asymmetry within the
LEFT breast indicating superimposition of normal dense
fibroglandular tissues.
IMPRESSION: No evidence of malignancy.

Patient may return to routine annual bilateral screening mammogram
schedule.

RECOMMENDATION:
Screening mammogram in one year.(Code:[id number])

I have discussed the findings and recommendations with the patient.
If applicable, a reminder letter will be sent to the patient
regarding the next appointment.

BI-RADS CATEGORY  1: Negative.

## 2022-05-29 ENCOUNTER — Other Ambulatory Visit: Payer: Self-pay | Admitting: Internal Medicine

## 2022-06-21 ENCOUNTER — Other Ambulatory Visit: Payer: Self-pay | Admitting: Internal Medicine

## 2022-06-29 ENCOUNTER — Ambulatory Visit: Payer: Commercial Managed Care - PPO | Admitting: Internal Medicine

## 2022-06-30 ENCOUNTER — Other Ambulatory Visit: Payer: Self-pay | Admitting: Internal Medicine

## 2022-08-23 ENCOUNTER — Encounter: Payer: Self-pay | Admitting: Internal Medicine

## 2022-08-23 DIAGNOSIS — E1065 Type 1 diabetes mellitus with hyperglycemia: Secondary | ICD-10-CM

## 2022-08-24 MED ORDER — ATORVASTATIN CALCIUM 10 MG PO TABS: ORAL_TABLET | ORAL | 3 refills | Status: DC

## 2022-10-17 ENCOUNTER — Other Ambulatory Visit: Payer: Self-pay | Admitting: Internal Medicine

## 2022-11-04 ENCOUNTER — Other Ambulatory Visit: Payer: Self-pay | Admitting: Internal Medicine

## 2022-11-07 ENCOUNTER — Other Ambulatory Visit: Payer: Self-pay | Admitting: Family Medicine

## 2022-11-07 DIAGNOSIS — Z801 Family history of malignant neoplasm of trachea, bronchus and lung: Secondary | ICD-10-CM

## 2022-11-19 ENCOUNTER — Other Ambulatory Visit: Payer: Self-pay | Admitting: Family Medicine

## 2022-11-19 DIAGNOSIS — Z801 Family history of malignant neoplasm of trachea, bronchus and lung: Secondary | ICD-10-CM

## 2022-11-30 ENCOUNTER — Ambulatory Visit: Payer: Commercial Managed Care - PPO | Admitting: Internal Medicine

## 2022-11-30 ENCOUNTER — Encounter: Payer: Self-pay | Admitting: Internal Medicine

## 2022-11-30 VITALS — BP 110/72 | HR 94 | Ht 62.0 in | Wt 123.0 lb

## 2022-11-30 DIAGNOSIS — E1065 Type 1 diabetes mellitus with hyperglycemia: Secondary | ICD-10-CM

## 2022-11-30 DIAGNOSIS — E1069 Type 1 diabetes mellitus with other specified complication: Secondary | ICD-10-CM | POA: Diagnosis not present

## 2022-11-30 DIAGNOSIS — E785 Hyperlipidemia, unspecified: Secondary | ICD-10-CM | POA: Diagnosis not present

## 2022-11-30 MED ORDER — ONETOUCH DELICA PLUS LANCET33G MISC: 3 refills | Status: DC

## 2022-11-30 MED ORDER — CONTOUR NEXT TEST VI STRP: ORAL_STRIP | 12 refills | Status: DC

## 2022-11-30 NOTE — Patient Instructions (Addendum)
Please talk to Brooke Shah about the Mobi pump.  Please read about the islet cell transplant/infusion.  Please use: - Lantus 14 >> 16 units in am - Lyumjev:  Insulin to carb ratio: 1:17 >> 1:14 Target: 120 Insulin sensitivity factor (correction factor): 70

## 2022-11-30 NOTE — Progress Notes (Signed)
Patient ID: Brooke Shah, female   DOB: May 12, 1966, 57 y.o.   MRN: [id number]  HPI: Brooke Shah is a 57 y.o.-year-old female, returning for follow-up for DM1, dx 2004, uncontrolled, with complications (+ mild DR). Last visit 8 months ago.  Interim history: No increased urination, blurry vision, nausea, chest pain. She used to change between insulin injections when flying (after experiencing hypoglycemia on the pump) and using the insulin pump when not flying.  However, for the last few months, she has been only on insulin injections. Her insurance did not approve Mounjaro, but she obtains it from a health spa. In the past, I referred her to [facility] for consideration for islet cell transplant.  However, did not answer the phone to schedule the patient education class.  DM1: Insulin pump: -She started to use an insulin pump in 2006. -She had a Revel 523 Medtronic pump since 03/2013. -Afterwards, she was on the 530 G Medtronic since 08/2017 (in warranty until 10/2017 as her old pump broke). She did not like the pump and we tried to change to an Omnipod but this was not approved even after peer to peer review.    -She was finally able to start an OmniPod DASH pump in 2020  -At our visit from 07/2021, she was on basal-bolus insulin regimen -continues to use insulin when flying (she is applied at [facility]) -She started the OmniPod 5 08/2021, but she used this intermittently -changing from pump to injections approximately once a week >> However, currently only on insulin injections  CGM: -She initially had a Dexcom CGM, then a freestyle libre CGM.  She developed an allergy to the adhesive for the [facility] but she then thought it could have been an allergy to her uniform.  However, her insurance stopped covering the CGM and I had to write a letter for her insurance. This did not help and she afterwards found out that the CGM was covered, but this was expensive due to her high deductible.   However, then she met her deductible and restarted the CGM before last visit.   -Previously off the sensor for almost a month due to lack of supplies  -prev. On Dexcom G6 -now on Dexcom G7 CGM  Supplies: - [employer]  Insulin: -Previously on Humalog -now on Lyumjev -she did not feel any difference after switching to this  Reviewed HbA1c levels: 11/07/2022: HbA1c 9.5% Lab Results  Component Value Date   HGBA1C 9.0 (A) 03/26/2022   HGBA1C 8.8 (A) 11/08/2021   HGBA1C 8.9 (A) 08/09/2021  10/16/2020: HbA1c 9.2% 06/24/2017: HbA1c 8.9%  05/31/2016: HbA1c 8.6% 01/11/2016: HbA1c 8.7% 09/29/2015: HbA1c 8.9% 09/28/2014: HbA1c 9.8%  Regimen: Pump off: - Lantus 14 units in am - Lyumjev:  Insulin to carb ratio: 1:10 (for high carb meals - try 1:8-9)  >> 1:17 Target: 120 Insulin sensitivity factor (correction factor): 70    -Also Ozempic 1 mg weekly >> Mounjaro 7.5 mg weekly >> stopped >> back on Mounjaro 7.5 mg weekly (from a wt loss clinic).  - changes infusion site: Every 3 days She had yeast infections in the past on Metformin.  She checks her blood sugars >4 times a day with her CGM:   Previously:   Prev:   Lowest sugar was 50s >> 60s >> 49 >> 41; she has hypoglycemia awareness in the 70s.  No previous hypoglycemia admissions.  She has a glucagon kit at home. Highest sugar was HI >> >400 >> 441 >> HI.  No previous  DKA admissions.  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   -No CKD: Last BUN/creatinine:  11/07/2022: 12/0.74, GFR 95, ACR 6 Lab Results  Component Value Date   BUN 13 04/06/2022   BUN 16 03/26/2022   CREATININE 0.64 04/06/2022   CREATININE 0.70 03/26/2022  . -+ HL; last set of lipids: 11/07/2022: 220/86/84/121 Lab Results  Component Value Date   CHOL 171 03/26/2022   HDL 60.80 03/26/2022   LDLCALC 82 03/26/2022   TRIG 143.0 03/26/2022   CHOLHDL 3 03/26/2022  On Lipitor 20.  - last eye exam was 01/23/2022: + Mild DR  reportedly.  - she has numbness and tingling in her feet.  Last foot exam 03/26/2022.  B12 level was normal 648 - 07/19/2015.  Latest TSH was normal: Lab Results  Component Value Date   TSH 1.04 03/26/2022  09/09/2017: TSH 1.18 10/04/2015: TSH 0.780, free T4 0.90  She has a history of hyperkalemia. She was previously on spironolactone but potassium increased so she had to stop.  Afterwards, she was back on it >> potassium increased to 5.8% 10/16/2020.  After stopping again spironolactone, potassium level returned normal: Lab Results  Component Value Date   K 5.0 04/06/2022   K 5.6 (H) 03/26/2022   K 4.4 01/04/2021   K 5.2 03/19/2019   K 4.8 03/18/2018   K 5.0 05/31/2016   K 5.6 (H) 01/13/2010  10/16/2020: Potassium 5.8  She has low bone density and was suggested to go back to hormone replacement.  She would not want to restart this.  ROS: + see HPI  I reviewed pt's medications, allergies, PMH, social hx, family hx, and changes were documented in the history of present illness. Otherwise, unchanged from my initial visit note.  PMH: - DM1 - HL - depression   Past Surgical History:  Procedure Laterality Date   ENDOMETRIAL ABLATION     TUBAL LIGATION Bilateral     Social History   Social History   Marital status: Married    Spouse name: N/A   children: yes   Occupational History   Flight attendant   Social History Main Topics   Smoking status: Never Smoker   Smokeless tobacco: Not on file   Alcohol use No   Drug use: No   Current Outpatient Medications on File Prior to Visit  Medication Sig Dispense Refill   atorvastatin (LIPITOR) 10 MG tablet TAKE 1 TABLET BY MOUTH  DAILY 90 tablet 3   BAQSIMI ONE PACK 3 MG/DOSE POWD FOR emergency low blood sugar: place 3mg  into nose AS NEEDED FOR UP TO ONE DOSE 2 each 3   Continuous Blood Gluc Sensor (DEXCOM G6 SENSOR) MISC Use as instructed change every 10 days 9 each 3   Continuous Blood Gluc Transmit (DEXCOM G6  TRANSMITTER) MISC 1 Device by Does not apply route every 3 (three) months. 1 each 3   CONTOUR NEXT TEST test strip USE AS INSTRUCTED TO CHECK  BLOOD SUGAR 5 TIMES A DAY 500 strip 12   fluconazole (DIFLUCAN) 150 MG tablet TAKE ONE TAB BY MOUTH SINGLE DOSE 1 tablet 1   GLOBAL EASE INJECT PEN NEEDLES 32G X 4 MM MISC USE FOUR times a DAY 200 each 1   Insulin Disposable Pump (OMNIPOD 5 G6 INTRO, GEN 5,) KIT USE AS DIRECTED AS NEEDED 1 kit 0   Insulin Disposable Pump (OMNIPOD 5 G6 POD, GEN 5,) MISC 1 each by Does not apply route every 3 (  three) days. 30 each 3   Insulin Lispro-aabc (LYUMJEV KWIKPEN) 100 UNIT/ML KwikPen inject 10 units THREE TIMES DAILY UNDER SKIN daily AS DIRECTED (max 30 untis daily) 15 mL 3   Insulin Lispro-aabc (LYUMJEV) 100 UNIT/ML SOLN Inject 50 Units into the skin daily. Inject up to 50 units a day in the insulin pump 50 mL 3   Lancets (ONETOUCH DELICA PLUS [id number]) MISC Use up to 6x a day 300 each 3   LANTUS SOLOSTAR 100 UNIT/ML Solostar Pen inject 20 units into THE SKIN DAILY 15 mL 5   magnesium oxide (MAG-OX) 400 MG tablet Take 400 mg by mouth daily.     Nutritional Supplements (JUICE PLUS FIBRE PO) Take by mouth.     OVER THE COUNTER MEDICATION      Semaglutide, 2 MG/DOSE, 8 MG/3ML SOPN Inject 2 mg as directed once a week. 9 mL 3   sertraline (ZOLOFT) 100 MG tablet      tirzepatide (MOUNJARO) 7.5 MG/0.5ML Pen Inject 7.5 mg into the skin once a week. 2 mL 5   valACYclovir (VALTREX) 1000 MG tablet      No current facility-administered medications on file prior to visit.   No Known Allergies   FH: - see HPI  PE: BP 110/72 (BP Location: Left Arm, Patient Position: Sitting, Cuff Size: Normal)   Pulse 94   Ht 5\' 2"  (1.575 m)   Wt 123 lb (55.8 kg)   SpO2 99%   BMI 22.50 kg/m  Wt Readings from Last 3 Encounters:  11/30/22 123 lb (55.8 kg)  03/26/22 125 lb 9.6 oz (57 kg)  11/08/21 126 lb 12.8 oz (57.5 kg)   Constitutional: normal weight, in NAD Eyes:  EOMI, no  exophthalmos ENT: no neck masses, no cervical lymphadenopathy Cardiovascular: Tachycardia, RR, No MRG Respiratory: CTA B Musculoskeletal: no deformities Skin:no rashes Neurological: no tremor with outstretched hands  ASSESSMENT: 1. DM1, uncontrolled, with long-term complications, with hyper and hypoglycemia - + mild DR  2. HL   3.  History of overweight  No FH of MTC, no personal h/o pancreatitis.  PLAN:  1. Patient with uncontrolled type 1 diabetes, previously improved when she was on weight watchers but worsening control after she came off the diet. Her sugar control again improved after she opted out of work (flight attendant) during the coronavirus pandemic and improved her diet and exercise.  However, afterwards, she returned to work and blood sugars worsened.  She was able to switch to an OmniPod 5 insulin pump integrated with the Dexcom G6 CGM, which she is using when she is not flying.  When flying, she uses insulin injections due to problems with insulin delivery during takeoff and landing.  She is also on Mounjaro or Ozempic (depending on availability), which were not covered by her insurance, which she obtains from a health spa. -At last visit, sugars were completely uncontrolled throughout the day.  They were improving during the night but increasing significantly after breakfast, lunch, and dinner.  However, reviewing the pump settings, she did not make the changes suggested at the previous visit so I recommended to strengthen her insulin to carb ratios and increase her basal rates.  She did have occasional low blood sugars around 6 AM, but most likely due to correction of high blood sugars while working at night.  Her control was inconsistent due to switching between using the pump and not using it.  At that time, I did refer her to [facility] to be evaluated  for islet cell transplant.  However, she did not go through with this. -At today's visit, she tells me that she was here  for about a full pancreatic transplant.  I explained that my intention was to refer her for pancreatic islet infusion/transplant.  Discussed about the need arises between the 2 and possible outcomes in terms of insulin dose reduction or even insulin independence.  Also discussed about the need for immunosuppression. She appears to be open to this option and I advised her to look into this and let me know if she wants me to place a referral again. CGM interpretation: -At today's visit, we reviewed her CGM downloads: It appears that 20% of values are in target range (goal >70%), while 79% are higher than 180 (goal <25%), and 1% are lower than 70 (goal <4%).  The calculated average blood sugar is 270.  The projected HbA1c for the next 3 months (GMI) is 9.8%. -Reviewing the CGM trends, sugars appear to be fluctuating mostly above the target range, improving slightly overnight and then increasing significantly after breakfast and remaining high throughout the day.  Upon questioning, she is taking Lantus in the morning, which we can continue, but I did advise her to increase the dose slightly.  However, it appears that the problem is not taking enough insulin for meals.  She tells me that she is eating meals but also grazing between the meals and she feels that if she is using a 1: 10 insulin to carb ratio, she feels her sugars are dropping too low.  As of now she is using a 1:17 ratio.  It does not appear to be in so I advised her to strengthen the ratio to 1: 14.  She agrees with this plan. -She is interested to start back on the pump -she prefers a pump with tubing, probably Mobi.  She would like to be able to disconnect the pump whenever taking off or landing, but otherwise to keep the pump on all the time.  This is a reasonable plan and we may be able to keep her in the closed-loop mode if she does not take the pump off completely, as required on the OmniPod.  I advised her to also look into the t:slim x2 pump  and I gave her a brochure for this.  Referral placed to the diabetes educator for prepump training.  I also advised her to check with her insurance whether this is covered. -For now, we will continue Mounjaro at the current dose, 7.5 mg weekly.  She tolerates it well. -  I advised her to: Patient Instructions  Please talk to Cristy Folks about the Mobi pump.  Please read about the islet cell transplant/infusion.  Please use: - Lantus 14 >> 16 units in am - Lyumjev:  Insulin to carb ratio: 1:17 >> 1:14 Target: 120 Insulin sensitivity factor (correction factor): 70  - advised to check sugars at different times of the day - 4x a day, rotating check times - advised for yearly eye exams >> she is UTD - return to clinic in 3-4 months  2. HL -Reviewed latest lipid panel from 10/2022: LDL higher than before, at 121, the rest of the fractions at goal -She continues on Lipitor 20 mg daily without side effects   3.  History of overweight -Now in the normal weight category, based on BMI -on Mounjaro, which are helping with weight loss  Total time spent for the visit: 40 min, not counting for the time  spent analyzing her CGM tracings, in precharting, post charting, reviewing her most recent labs, records per care everywhere, discussing about different types of pancreatic transplant and about different pump insulin options.  Carlus Pavlov, MD PhD [facility]

## 2022-12-05 ENCOUNTER — Telehealth: Payer: Self-pay | Admitting: Nutrition

## 2022-12-05 ENCOUNTER — Encounter: Payer: Commercial Managed Care - PPO | Attending: Internal Medicine | Admitting: Nutrition

## 2022-12-05 DIAGNOSIS — E1065 Type 1 diabetes mellitus with hyperglycemia: Secondary | ICD-10-CM | POA: Insufficient documentation

## 2022-12-05 NOTE — Telephone Encounter (Signed)
Patient is here for an office visit and is interested in the [employer] Mobi pump.  Per Gerald Stabs' s direction, and since it is not available yet, he is saying to order it through [employer], and it will go to [employer] and put her on the waiting list.

## 2022-12-05 NOTE — Progress Notes (Signed)
Patient says she is interested in the Mobi insulin pump.  We called Gerald Stabs, and he says it will be on the market in late spring.  We reviewed the need for phone access to this, and she is ok with this.  She is wanting the short tubing thinking that this would cause less fluctuations in her blood sugar readings when she is taking off, and landing at work.  She was also shown the beta bionic pump and she will do some research on this as well.  She had no final questions.

## 2022-12-25 ENCOUNTER — Ambulatory Visit
Admission: RE | Admit: 2022-12-25 | Discharge: 2022-12-25 | Disposition: A | Discharge status: Home / Self Care | Payer: Commercial Managed Care - PPO | Source: Ambulatory Visit | Attending: Family Medicine | Admitting: Family Medicine

## 2022-12-25 DIAGNOSIS — Z801 Family history of malignant neoplasm of trachea, bronchus and lung: Secondary | ICD-10-CM

## 2023-01-22 ENCOUNTER — Encounter: Payer: Self-pay | Admitting: Internal Medicine

## 2023-01-22 ENCOUNTER — Other Ambulatory Visit: Payer: Self-pay | Admitting: Internal Medicine

## 2023-01-22 DIAGNOSIS — E1065 Type 1 diabetes mellitus with hyperglycemia: Secondary | ICD-10-CM

## 2023-01-22 MED ORDER — DEXCOM G6 SENSOR MISC: 3 refills | Status: DC

## 2023-01-22 MED ORDER — DEXCOM G6 TRANSMITTER MISC: 1.0000 | 3 refills | Status: DC

## 2023-01-22 MED ORDER — LYUMJEV 100 UNIT/ML IJ SOLN: 50.0000 [IU] | Freq: Every day | INTRAMUSCULAR | 3 refills | Status: DC

## 2023-01-23 ENCOUNTER — Other Ambulatory Visit: Payer: Self-pay | Admitting: Internal Medicine

## 2023-01-23 DIAGNOSIS — E1065 Type 1 diabetes mellitus with hyperglycemia: Secondary | ICD-10-CM

## 2023-03-29 ENCOUNTER — Telehealth: Payer: Self-pay

## 2023-03-29 NOTE — Telephone Encounter (Signed)
Inbound fax from DME supplier requesting form be completed and faxed with clinical notes. DME supplies ordered via [employer] through online portal. [employer] Dexcom G6 sensors and transmitter

## 2023-04-02 ENCOUNTER — Ambulatory Visit: Payer: Commercial Managed Care - PPO | Admitting: Internal Medicine

## 2023-07-26 ENCOUNTER — Ambulatory Visit: Payer: Commercial Managed Care - PPO | Admitting: Internal Medicine

## 2023-07-26 ENCOUNTER — Encounter: Payer: Self-pay | Admitting: Internal Medicine

## 2023-07-26 VITALS — BP 102/60 | HR 93 | Ht 62.0 in | Wt 120.6 lb

## 2023-07-26 DIAGNOSIS — E1069 Type 1 diabetes mellitus with other specified complication: Secondary | ICD-10-CM

## 2023-07-26 DIAGNOSIS — E1065 Type 1 diabetes mellitus with hyperglycemia: Secondary | ICD-10-CM

## 2023-07-26 DIAGNOSIS — E663 Overweight: Secondary | ICD-10-CM | POA: Diagnosis not present

## 2023-07-26 DIAGNOSIS — E785 Hyperlipidemia, unspecified: Secondary | ICD-10-CM | POA: Diagnosis not present

## 2023-07-26 LAB — COMPREHENSIVE METABOLIC PANEL
ALT: 56 U/L — ABNORMAL HIGH (ref 0–35)
AST: 50 U/L — ABNORMAL HIGH (ref 0–37)
Albumin: 4.2 g/dL (ref 3.5–5.2)
Alkaline Phosphatase: 98 U/L (ref 39–117)
BUN: 12 mg/dL (ref 6–23)
CO2: 33 meq/L — ABNORMAL HIGH (ref 19–32)
Calcium: 9.3 mg/dL (ref 8.4–10.5)
Chloride: 102 meq/L (ref 96–112)
Creatinine, Ser: 0.7 mg/dL (ref 0.40–1.20)
GFR: 97.05 mL/min (ref >60.00)
Glucose, Bld: 141 mg/dL — ABNORMAL HIGH (ref 70–99)
Potassium: 4.7 meq/L (ref 3.5–5.1)
Sodium: 140 meq/L (ref 135–145)
Total Bilirubin: 0.5 mg/dL (ref 0.2–1.2)
Total Protein: 6.4 g/dL (ref 6.0–8.3)

## 2023-07-26 LAB — MICROALBUMIN / CREATININE URINE RATIO
Creatinine,U: 251.6 mg/dL
Microalb Creat Ratio: 1.2 mg/g (ref 0.0–30.0)
Microalb, Ur: 2.9 mg/dL — ABNORMAL HIGH (ref 0.0–1.9)

## 2023-07-26 LAB — LIPID PANEL
Cholesterol: 173 mg/dL (ref 0–200)
HDL: 72.3 mg/dL (ref >39.00)
LDL Cholesterol: 86 mg/dL (ref 0–99)
NonHDL: 100.66
Total CHOL/HDL Ratio: 2
Triglycerides: 71 mg/dL (ref 0.0–149.0)
VLDL: 14.2 mg/dL (ref 0.0–40.0)

## 2023-07-26 LAB — POCT GLYCOSYLATED HEMOGLOBIN (HGB A1C): Hemoglobin A1C: 8.4 % — AB (ref 4.0–5.6)

## 2023-07-26 LAB — TSH: TSH: 0.99 u[IU]/mL (ref 0.35–5.50)

## 2023-07-26 NOTE — Progress Notes (Addendum)
Patient ID: Brooke Shah, female   DOB: 05-02-1966, 57 y.o.   MRN: [id number]  HPI: Brooke Shah is a 57 y.o.-year-old female, returning for follow-up for DM1, dx 2004, uncontrolled, with complications (+ mild DR). Last visit 8 months ago.  Interim history: No increased urination, blurry vision, nausea, chest pain. She used to change between insulin injections when flying (after experiencing hypoglycemia on the pump) and using the insulin pump when not flying.  Few months before last visit she was back on injections but since then she started on the Mobi insulin pump.  She has to disconnect the pump when taking off and landing. Her insurance did not approve Mounjaro, but she obtains it from a health spa.  DM1: Insulin pump: -She started to use an insulin pump in 2006. -She had a Revel 523 Medtronic pump since 03/2013. -Afterwards, she was on the 530 G Medtronic since 08/2017 (in warranty until 10/2017 as her old pump broke). She did not like the pump and we tried to change to an Omnipod but this was not approved even after peer to peer review.    -She was finally able to start an OmniPod DASH pump in 2020  -At our visit from 07/2021, she was on basal-bolus insulin regimen -continues to use insulin when flying (she is applied at [facility]) -She started the OmniPod 5 08/2021, but she used this intermittently -then stopped -started Mobi Tandem insulin pump 02/2023  CGM: -She initially had a Dexcom CGM, then a freestyle libre CGM.  She developed an allergy to the adhesive for the [facility] but she then thought it could have been an allergy to her uniform.  However, her insurance stopped covering the CGM and I had to write a letter for her insurance. This did not help and she afterwards found out that the CGM was covered, but this was expensive due to her high deductible.  However, then she met her deductible and restarted the CGM before last visit.   -Previously off the sensor for almost a month  due to lack of supplies  -prev. On Dexcom G6 -now on Dexcom G7 CGM  Supplies: -[place]  Insulin: -Previously on Humalog -now on Lyumjev -she did not feel any difference after switching to this  Reviewed HbA1c levels: 11/07/2022: HbA1c 9.5% Lab Results  Component Value Date   HGBA1C 9.0 (A) 03/26/2022   HGBA1C 8.8 (A) 11/08/2021   HGBA1C 8.9 (A) 08/09/2021  10/16/2020: HbA1c 9.2% 06/24/2017: HbA1c 8.9%  05/31/2016: HbA1c 8.6% 01/11/2016: HbA1c 8.7% 09/29/2015: HbA1c 8.9% 09/28/2014: HbA1c 9.8%  Regimen: Pump on: - basal rates: 12 am: 0.45 >>  0.65 >> 0.20 8 am: 0.45 >> 0.85 >> 0.30 - ICR: 12 am: 1:10  (for high carb+ high fat meals - try a dual wave bolus) >> 1:18 - target: 120 >> 110 - ISF: 70 >> 95 - Active Insulin Time: 4h  TDD from basal insulin:  47-64% >> ~50% >> 49-60% >> 51% (8 units) >> 8 units (46%) TDD from bolus insulin: 36-51% >> ~50% >> 40-51% >> 49% (7.5 units) >> 9.4 units (54%) Total daily dose: Up to 60 units a day >> 18-30 units - changes infusion site: Every 3 days  Pump off: - Lantus 14 >> 16 units in am - Lyumjev:  Insulin to carb ratio: 1:17 >> 1:14 Target: 120 Insulin sensitivity factor (correction factor): 70   Also Ozempic 1 mg weekly >> Mounjaro 7.5 mg weekly >> stopped >> back on Mounjaro 7.5 mg weekly (  from a wt loss clinic). She had yeast infections in the past on Metformin.  She checks her blood sugars >4 times a day with her CGM;  Previously:   Previously:   Lowest sugar was 49 >> 41; she has hypoglycemia awareness in the 70s.  No previous hypoglycemia admissions.  She has a glucagon kit at home. Highest sugar was 441 >> HI.  No previous DKA admissions.  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   -No CKD: Last BUN/creatinine:  11/07/2022: 12/0.74, GFR 95, ACR 6 Lab Results  Component Value Date   BUN 13 04/06/2022   BUN 16 03/26/2022   CREATININE 0.64 04/06/2022   CREATININE 0.70 03/26/2022    Lab Results  Component Value Date   MICRALBCREAT 0.5 03/26/2022   MICRALBCREAT 0.5 10/31/2020   MICRALBCREAT 1.3 03/19/2019   MICRALBCREAT 0.8 03/18/2018   -+ HL; last set of lipids:  11/07/2022: 220/86/84/121 Lab Results  Component Value Date   CHOL 171 03/26/2022   HDL 60.80 03/26/2022   LDLCALC 82 03/26/2022   TRIG 143.0 03/26/2022   CHOLHDL 3 03/26/2022  On Lipitor 20.  - last eye exam was 01/2023: + Mild DR reportedly.  - she has numbness and tingling in her feet.  Last foot exam 03/26/2022.  B12 level was normal 648 - 07/19/2015.  Latest TSH was normal: Lab Results  Component Value Date   TSH 1.04 03/26/2022  09/09/2017: TSH 1.18 10/04/2015: TSH 0.780, free T4 0.90  She has a history of hyperkalemia. She was previously on spironolactone but potassium increased so she had to stop.  Afterwards, she was back on it >> potassium increased to 5.8% 10/16/2020.  After stopping again spironolactone, potassium level returned normal: Lab Results  Component Value Date   K 5.0 04/06/2022   K 5.6 (H) 03/26/2022   K 4.4 01/04/2021   K 5.2 03/19/2019   K 4.8 03/18/2018   K 5.0 05/31/2016   K 5.6 (H) 01/13/2010  10/16/2020: Potassium 5.8  She had a low bone density and was suggested to go back to hormone replacement.  She would not want to restart this.  In the past, I referred her to [facility] for consideration for islet cell transplant.  However, she did not schedule the patient education class.  ROS: + see HPI  I reviewed pt's medications, allergies, PMH, social hx, family hx, and changes were documented in the history of present illness. Otherwise, unchanged from my initial visit note.  PMH: - DM1 - HL - depression   Past Surgical History:  Procedure Laterality Date   ENDOMETRIAL ABLATION     TUBAL LIGATION Bilateral     Social History   Social History   Marital status: Married    Spouse name: N/A   children: yes   Occupational History   Flight  attendant   Social History Main Topics   Smoking status: Never Smoker   Smokeless tobacco: Not on file   Alcohol use No   Drug use: No   Current Outpatient Medications on File Prior to Visit  Medication Sig Dispense Refill   atorvastatin (LIPITOR) 10 MG tablet TAKE 1 TABLET BY MOUTH  DAILY 90 tablet 3   BAQSIMI ONE PACK 3 MG/DOSE POWD FOR emergency low blood sugar: place 3mg  into nose AS NEEDED FOR UP TO ONE DOSE 2 each 3   Continuous Blood Gluc Sensor (DEXCOM G6 SENSOR) MISC Use as instructed change every 10 days 9 each  3   Continuous Blood Gluc Transmit (DEXCOM G6 TRANSMITTER) MISC 1 Device by Does not apply route every 3 (three) months. 1 each 3   fluconazole (DIFLUCAN) 150 MG tablet TAKE ONE TAB BY MOUTH SINGLE DOSE 1 tablet 1   GLOBAL EASE INJECT PEN NEEDLES 32G X 4 MM MISC USE FOUR times a DAY 200 each 1   glucose blood (CONTOUR NEXT TEST) test strip USE AS INSTRUCTED TO CHECK  BLOOD SUGAR 5 TIMES A DAY 500 strip 12   Insulin Aspart, w/Niacinamide, (FIASP) 100 UNIT/ML SOLN Inject under skin up to 50 units a day in the insulin pump 50 mL 3   Insulin Disposable Pump (OMNIPOD 5 G6 INTRO, GEN 5,) KIT USE AS DIRECTED AS NEEDED 1 kit 0   Insulin Disposable Pump (OMNIPOD 5 G6 POD, GEN 5,) MISC 1 each by Does not apply route every 3 (three) days. 30 each 3   Insulin Lispro-aabc (LYUMJEV KWIKPEN) 100 UNIT/ML KwikPen inject 10 units THREE TIMES DAILY UNDER SKIN daily AS DIRECTED (max 30 untis daily) 15 mL 3   Lancets (ONETOUCH DELICA PLUS LANCET33G) MISC Use up to 6x a day 300 each 3   LANTUS SOLOSTAR 100 UNIT/ML Solostar Pen inject 20 units into THE SKIN DAILY 15 mL 5   magnesium oxide (MAG-OX) 400 MG tablet Take 400 mg by mouth daily.     Nutritional Supplements (JUICE PLUS FIBRE PO) Take by mouth.     OVER THE COUNTER MEDICATION      sertraline (ZOLOFT) 100 MG tablet      tirzepatide (MOUNJARO) 7.5 MG/0.5ML Pen Inject 7.5 mg into the skin once a week. 2 mL 5   valACYclovir (VALTREX) 1000  MG tablet      No current facility-administered medications on file prior to visit.   No Known Allergies   FH: - see HPI  PE: BP 102/60   Pulse 93   Ht 5\' 2"  (1.575 m)   Wt 120 lb 9.6 oz (54.7 kg)   SpO2 99%   BMI 22.06 kg/m  Wt Readings from Last 3 Encounters:  07/26/23 120 lb 9.6 oz (54.7 kg)  11/30/22 123 lb (55.8 kg)  03/26/22 125 lb 9.6 oz (57 kg)   Constitutional: normal weight, in NAD Eyes:  EOMI, no exophthalmos ENT: no neck masses, no cervical lymphadenopathy Cardiovascular: Tachycardia, RR, No MRG Respiratory: CTA B Musculoskeletal: no deformities Skin:no rashes Neurological: no tremor with outstretched hands Diabetic Foot Exam - Simple   Simple Foot Form Diabetic Foot exam was performed with the following findings: Yes 07/26/2023  9:20 AM  Visual Inspection No deformities, no ulcerations, no other skin breakdown bilaterally: Yes Sensation Testing Intact to touch and monofilament testing bilaterally: Yes Pulse Check Posterior Tibialis and Dorsalis pulse intact bilaterally: Yes Comments     ASSESSMENT: 1. DM1, uncontrolled, with long-term complications, with hyper and hypoglycemia - + mild DR  2. HL   3.  History of overweight  No FH of MTC, no personal h/o pancreatitis.  PLAN:  1. Patient with very uncontrolled type 1 diabetes, previously improved when she was on weight watchers for worsening control after she came off the diet.  Her control again improved after she opted out of work (she is a Financial controller) during the coronavirus pandemic and improved her diet and exercise.  However, afterwards, she returns to work and sugars were sent.  She tried an OmniPod 5 insulin pump integrated with the Dexcom G6 CGM but this did not work well  for her when she was flying so she was using this intermittently, without good results.  At last visit, she was on insulin injections.  We discussed at the last 2 visits about her referral to [facility] to be evaluated  for islet cell transplant.  She did not go through with this.  At last visit we also discussed about the full pancreatic transplant and discussed about differences between the 2 and possible outcomes in terms of insulin dose reduction and even insulin independence.  We also discussed about the need for immune suppression.  She was open to this option so I advised her to look into this and let me know if she wanted me to place the referral again. -At last visit, sugars were fluctuating mostly above the target range so we increased her Lantus dose and we strengthened her insulin to carb ratios.  We continued her Mounjaro, 7.5 mg weekly, which she obtains from a health spa.  I did advise her to look into other pumps and she was able to obtain the Mobi tandem pump since last visit. CGM interpretation: -At today's visit, we reviewed her CGM downloads: It appears that 44% of values are in target range (goal >70%), while 56% are higher than 180 (goal <25%), and 0.1% are lower than 70 (goal <4%).  The calculated average blood sugar is 200.   -Reviewing the CGM trends, sugars appear to be less fluctuating than before, definitely improved and with less lows.  However, they still increase after meals and upon review of individual pump tracings, she is not in enough carbs into the pump.  She mentions that she is afraid that she may drop her blood sugars too low if she handles all of the carbs.  I advised mandatory to understand what the best insulin to carb ratio is for her.  Also, she is quite aggressive with correcting high blood sugars but does not necessarily pay attention to the sensor arrows.  At today's visit, she was preparing to bolus for a blood sugar in the 240s, however, this value was lower than previously, when the pump already gave her a correction bolus and it was trending down.  I advised her to try to see where the blood sugar stabilizes before bolusing again.  -She changed her ICR's yesterday from 1:15  to 1:18.  However, we discussed that usually in the auto mode, a stronger insulin to carb ratio is needed for the pump to understand that she needs more insulin per meal and per day.  We strengthened her insulin to carb ratios at today's visit. -Also, we discussed about doing dual wave boluses.  She did start to do them and she felt that these were helping some but she was not sure which ratio to use.  I advised her to start with 70-30% and a change from there.  This will be extended over 2 hours. -She is preparing to go to [employer] where she would be quite active.  He is worried about low blood sugars.  We discussed about strategies to avoid low blood sugars including changing to exercise mode (explained what this is and how it changes the target) and also relaxing her insulin to carb ratios, if needed, afterwards -Her basal rates are very low, decreased since last visit, and we discussed that if she is not in the auto mode, her blood sugars may go very high if we continue these low values.  I did demonstrate on her downloads that the pump  is actually starting to give her much more insulin overnight and 0.2 units an hour does program in her pump.  I did advise her to increase basal rate to 0.5 units an hour throughout the day. -She is worried that she is losing too much weight and wonders whether we should not decrease the dose of Mounjaro.  From last visit, she lost almost 3 more pounds.  Although Greggory Keen is helping her blood sugars and her insulin requirements quite a bit, we did discuss about reducing the dose to 5 mg weekly. -She would need more training about her type 1 diabetes.  I recommended to see the diabetes educator between our appointments and I also given her a reference for an excellent book about managing type 1 diabetes -  I advised her to: Patient Instructions  Please use the following pump settings: - basal rates: 12 am: 0.2 >> 0.5 8 am: 0.3 >> 0.5 - ICR: 12 am: 1:18 >> 1:15   (for high carb+ high fat meals - try a dual wave bolus - start with 70:30%) - target: 110 - ISF: 95 - Active Insulin Time: 4h  Please enter all carbs into the pump at the beginning of the meal.  Try to be a little less aggressive with correction boluses.  Consider using the exercise mode when you are more active. You may also need to relax the ICRs (maybe 1:17).  Try to decrease Mounjaro to 5 mg weekly.  Try to read: - "Think like a pancreas" by Mardee Postin  Please stop at the lab.  Please return in 3 to 4 months.  - we checked her HbA1c: 8.4% (lower) - advised to check sugars at different times of the day - 4x a day, rotating check times - advised for yearly eye exams >> she is UTD - will check annual labs today - return to clinic in 3-4 months  2. HL -Reviewed latest lipid panel from 01/2023: 220/86/80/121- LDL higher than before, at 121, otherwise fractions at goal -She continues on Lipitor 20 mg daily without side effects  -Per her preference, we will recheck her lipid panel  3.  History of overweight -Now in the normal weight category, based on BMI -She continues on Mounjaro, which is helping with weight loss, but will decrease the dose at today's visit  Total time spent for the visit (aside CGM interpretation): 40 min, in precharting, post charting, reviewing her pump downloads, reviewing correct bolusing techniques and different options for bolusing, making pump setting changes, reviewing previous labs and and developing a plan to avoid hypo- and hyper-glycemia (please see discussed topics above).  Component     Latest Ref Rng 07/26/2023  Hemoglobin A1C     4.0 - 5.6 % 8.4 !   Cholesterol     0 - 200 mg/dL 161   Triglycerides     0.0 - 149.0 mg/dL 09.6   HDL Cholesterol     >39.00 mg/dL 04.54   VLDL     0.0 - 40.0 mg/dL 09.8   LDL (calc)     0 - 99 mg/dL 86   Total CHOL/HDL Ratio 2   NonHDL 100.66   TSH     0.35 - 5.50 uIU/mL 0.99   Microalb, Ur     0.0  - 1.9 mg/dL 2.9 (H)   Creatinine,U     mg/dL 119.1   MICROALB/CREAT RATIO     0.0 - 30.0 mg/g 1.2   Glucose     70 -  99 mg/dL 161 (H)   BUN     6 - 23 mg/dL 12   Creatinine     0.96 - 1.20 mg/dL 0.45   Sodium     409 - 145 mEq/L 140   Potassium     3.5 - 5.1 mEq/L 4.7   Chloride     96 - 112 mEq/L 102   CO2     19 - 32 mEq/L 33 (H)   Calcium     8.4 - 10.5 mg/dL 9.3   Total Protein     6.0 - 8.3 g/dL 6.4   Total Bilirubin     0.2 - 1.2 mg/dL 0.5   AST     0 - 37 U/L 50 (H)   ALT     0 - 35 U/L 56 (H)   GFR     >60.00 mL/min 97.05   Alkaline Phosphatase     39 - 117 U/L 98   Albumin     3.5 - 5.2 g/dL 4.2   AST and ALT are elevated.  I will advise her to make sure she is not taking any supplements, including Tylenol, reduce fatty foods and alcohol.  I will also have her discuss with PCP about these.  Otherwise, labs are mostly at goal.  Carlus Pavlov, MD PhD [facility]

## 2023-07-26 NOTE — Patient Instructions (Addendum)
Please use the following pump settings: - basal rates: 12 am: 0.2 >> 0.5 8 am: 0.3 >> 0.5 - ICR: 12 am: 1:18 >> 1:15  (for high carb+ high fat meals - try a dual wave bolus - start with 70:30%) - target: 110 - ISF: 95 - Active Insulin Time: 4h  Please enter all carbs into the pump at the beginning of the meal.  Try to be a little less aggressive with correction boluses.  Consider using the exercise mode when you are more active. You may also need to relax the ICRs (maybe 1:17).  Try to decrease Mounjaro to 5 mg weekly.  Try to read: - "Think like a pancreas" by Mardee Postin  Please stop at the lab.  Please return in 3 to 4 months.

## 2023-08-07 ENCOUNTER — Encounter: Payer: Self-pay | Admitting: Internal Medicine

## 2023-09-27 ENCOUNTER — Telehealth: Payer: Self-pay

## 2023-09-27 MED ORDER — ACCU-CHEK GUIDE TEST VI STRP: ORAL_STRIP | 3 refills | Status: DC

## 2023-09-27 MED ORDER — ACCU-CHEK SOFTCLIX LANCETS MISC: 3 refills | Status: DC

## 2023-09-27 MED ORDER — ACCU-CHEK GUIDE W/DEVICE KIT: PACK | 0 refills | Status: DC

## 2023-09-27 NOTE — Telephone Encounter (Signed)
Requested Prescriptions   Signed Prescriptions Disp Refills   Blood Glucose Monitoring Suppl (ACCU-CHEK GUIDE) w/Device KIT 1 kit 0    Sig: Use to check glucose 6 times a day    Authorizing Provider: Carlus Pavlov    Ordering User: Daanish Copes S   Accu-Chek Softclix Lancets lancets 600 each 3    Sig: Use to check glucose 6 times a day    Authorizing Provider: Carlus Pavlov    Ordering User: Zarayah Lanting S   glucose blood (ACCU-CHEK GUIDE TEST) test strip 600 each 3    Sig: Use to check glucose 6 times a day    Authorizing Provider: Carlus Pavlov    Ordering User: Pollie Meyer

## 2023-10-02 ENCOUNTER — Encounter: Payer: Self-pay | Admitting: Internal Medicine

## 2023-11-20 ENCOUNTER — Other Ambulatory Visit: Payer: Self-pay | Admitting: Internal Medicine

## 2023-11-25 ENCOUNTER — Ambulatory Visit: Payer: Commercial Managed Care - PPO | Admitting: Internal Medicine

## 2023-11-25 ENCOUNTER — Encounter: Payer: Self-pay | Admitting: Internal Medicine

## 2023-11-25 VITALS — BP 118/64 | HR 82 | Ht 62.0 in | Wt 119.4 lb

## 2023-11-25 DIAGNOSIS — E785 Hyperlipidemia, unspecified: Secondary | ICD-10-CM | POA: Diagnosis not present

## 2023-11-25 DIAGNOSIS — E1069 Type 1 diabetes mellitus with other specified complication: Secondary | ICD-10-CM | POA: Diagnosis not present

## 2023-11-25 DIAGNOSIS — Z9189 Other specified personal risk factors, not elsewhere classified: Secondary | ICD-10-CM

## 2023-11-25 DIAGNOSIS — E1065 Type 1 diabetes mellitus with hyperglycemia: Secondary | ICD-10-CM | POA: Diagnosis not present

## 2023-11-25 LAB — POCT GLYCOSYLATED HEMOGLOBIN (HGB A1C): Hemoglobin A1C: 10.3 % — AB (ref 4.0–5.6)

## 2023-11-25 MED ORDER — GLUCAGON 3 MG/DOSE NA POWD
3.0000 mg | Freq: Once | NASAL | 11 refills | Status: AC | PRN
Start: 2023-11-25 — End: ?

## 2023-11-25 NOTE — Patient Instructions (Addendum)
 Please change: - Lantus  9 units 2x a day - Lyumjev  1:14 for all meals  Target: 120 Insulin  sensitivity factor (correction factor): 70  Try to restart the pump ASAP.  Use the following pump settings: - basal rates: 12 am: 0.5 8 am: 0.5 - ICR: 12 am: 1:15 (for high carb+ high fat meals - try a dual wave bolus - start with 70:30%) - target: 110 - ISF: 95 - Active Insulin  Time: 4h  Please enter all carbs into the pump at the beginning of the meal.  Consider using the exercise mode when you are more active. You may also need to relax the ICRs (maybe 1:17).  Also continue: - Mounjaro  5 mg weekly.  Please return in 3 months.

## 2023-11-25 NOTE — Progress Notes (Signed)
 Patient ID: Brooke Shah, female   DOB: 1966/08/23, 58 y.o.   MRN: [id number]  HPI: Brooke Shah is a 58 y.o.-year-old female, returning for follow-up for DM1, dx 2004, uncontrolled, with complications (+ mild DR). Last visit 4 months ago.  Interim history: She lately has increased thirst  and urination, but no blurry vision, nausea. Her insurance did not approve Mounjaro , but she obtains it from a health spa.  DM1: Insulin  pump: -She started to use an insulin  pump in 2006. -She had a Revel 523 Medtronic pump since 03/2013. -Afterwards, she was on the 530 G Medtronic since 08/2017 (in warranty until 10/2017 as her old pump broke). She did not like the pump and we tried to change to an Omnipod but this was not approved even after peer to peer review.    -She was finally able to start an OmniPod DASH pump in 2020  -At our visit from 07/2021, she was on basal-bolus insulin  regimen -continues to use insulin  when flying -She started the OmniPod 5 08/2021, but she used this intermittently -then stopped -started Mobi Tandem insulin  pump 02/2023 - off since end of 09/2023 2/2 scar tissue and obstructed infusion sites  CGM: -She initially had a Dexcom CGM, then a freestyle libre CGM.  She developed an allergy to the adhesive for the [facility] but she then thought it could have been an allergy to her uniform.  However, her insurance stopped covering the CGM and I had to write a letter for her insurance. This did not help and she afterwards found out that the CGM was covered, but this was expensive due to her high deductible.  However, then she met her deductible and restarted the CGM before last visit.   -Previously off the sensor for almost a month due to lack of supplies  -prev. On Dexcom G6 -now on Dexcom G7 CGM  Supplies: -[place]  Insulin : -Previously on Humalog  -now on Lyumjev  -she did not feel any difference after switching to this  Reviewed HbA1c levels: 11/07/2022: HbA1c  9.5% Lab Results  Component Value Date   HGBA1C 8.4 (A) 07/26/2023   HGBA1C 9.0 (A) 03/26/2022   HGBA1C 8.8 (A) 11/08/2021  10/16/2020: HbA1c 9.2% 06/24/2017: HbA1c 8.9%  05/31/2016: HbA1c 8.6% 01/11/2016: HbA1c 8.7% 09/29/2015: HbA1c 8.9% 09/28/2014: HbA1c 9.8%  Regimen: Pump on:  Pump off - currently: - Lantus  14 >> 16 >> but actually using only 9 units in am! - Lyumjev :  Insulin  to carb ratio: 1:17 >> 1:14 >> actually using only 1:17 Target: 120 Insulin  sensitivity factor (correction factor): 70   Also Ozempic  1 mg weekly >> Mounjaro  7.5 mg weekly >> stopped >> back on Mounjaro  7.5 mg weekly (from a wt loss clinic) >> 5 mg weekly She had yeast infections in the past on Metformin .  She checks her blood sugars >4 times a day with her CGM:  Previously:  Previously:  Lowest sugar was 49 >> 41 >> 53; she has hypoglycemia awareness in the 70s.  No previous hypoglycemia admissions.  She has a glucagon  kit at home. Highest sugar was 441 >> HI >> HI.  No previous DKA admissions.  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   -No CKD: Last BUN/creatinine:  Lab Results  Component Value Date   BUN 12 07/26/2023   BUN 13 04/06/2022   CREATININE 0.70 07/26/2023   CREATININE 0.64 04/06/2022   Lab Results  Component Value Date   MICRALBCREAT 1.2 07/26/2023  MICRALBCREAT 0.5 03/26/2022   MICRALBCREAT 0.5 10/31/2020   MICRALBCREAT 1.3 03/19/2019   MICRALBCREAT 0.8 03/18/2018   -+ HL; last set of lipids: Lab Results  Component Value Date   CHOL 173 07/26/2023   HDL 72.30 07/26/2023   LDLCALC 86 07/26/2023   TRIG 71.0 07/26/2023   CHOLHDL 2 07/26/2023  On Lipitor 20.  - last eye exam was 01/2023: + Mild DR reportedly.  - she has numbness and tingling in her feet.  Last foot exam 07/26/2023.  B12 level was normal 648 - 07/19/2015.  Latest TSH was normal: Lab Results  Component Value Date   TSH 0.99 07/26/2023  09/09/2017: TSH 1.18 10/04/2015:  TSH 0.780, free T4 0.90  She has a history of hyperkalemia. She was previously on spironolactone but potassium increased so she had to stop.  Afterwards, she was back on it >> potassium increased to 5.8% 10/16/2020.  After stopping again spironolactone, potassium level returned normal: Lab Results  Component Value Date   K 4.7 07/26/2023   K 5.0 04/06/2022   K 5.6 (H) 03/26/2022   K 4.4 01/04/2021   K 5.2 03/19/2019   K 4.8 03/18/2018   K 5.0 05/31/2016   K 5.6 (H) 01/13/2010  10/16/2020: Potassium 5.8  She had a low bone density and was suggested to go back to hormone replacement.  She would not want to restart this.  In the past, I referred her to [facility] for consideration for islet cell transplant.  However, she did not schedule the patient education class.  ROS: + see HPI  I reviewed pt's medications, allergies, PMH, social hx, family hx, and changes were documented in the history of present illness. Otherwise, unchanged from my initial visit note.  PMH: - DM1 - HL - depression   Past Surgical History:  Procedure Laterality Date   ENDOMETRIAL ABLATION     TUBAL LIGATION Bilateral     Social History   Social History   Marital status: Married    Spouse name: N/A   children: yes   Occupational History   Flight attendant   Social History Main Topics   Smoking status: Never Smoker   Smokeless tobacco: Not on file   Alcohol use No   Drug use: No   Current Outpatient Medications on File Prior to Visit  Medication Sig Dispense Refill   Accu-Chek Softclix Lancets lancets Use to check glucose 6 times a day 600 each 3   atorvastatin  (LIPITOR) 10 MG tablet TAKE 1 TABLET BY MOUTH  DAILY 90 tablet 3   BAQSIMI  ONE PACK 3 MG/DOSE POWD FOR emergency low blood sugar: place 3mg  into nose AS NEEDED FOR UP TO ONE DOSE 2 each 3   Blood Glucose Monitoring Suppl (ACCU-CHEK GUIDE) w/Device KIT Use to check glucose 6 times a day 1 kit 0   fluconazole  (DIFLUCAN ) 150 MG tablet  TAKE ONE TAB BY MOUTH SINGLE DOSE 1 tablet 1   GLOBAL EASE INJECT PEN NEEDLES 32G X 4 MM MISC USE FOUR times a DAY 200 each 1   glucose blood (ACCU-CHEK GUIDE TEST) test strip Use to check glucose 6 times a day 600 each 3   Insulin  Aspart, w/Niacinamide, (FIASP) 100 UNIT/ML SOLN Inject under skin up to 50 units a day in the insulin  pump 50 mL 3   Insulin  Lispro-aabc (LYUMJEV  KWIKPEN) 100 UNIT/ML KwikPen inject 10 units THREE TIMES DAILY UNDER SKIN AS DIRECTED (max 30 units daily) 15 mL 5   LANTUS  SOLOSTAR 100  UNIT/ML Solostar Pen inject 20 units into THE SKIN DAILY 15 mL 5   magnesium oxide (MAG-OX) 400 MG tablet Take 400 mg by mouth daily.     Nutritional Supplements (JUICE PLUS FIBRE PO) Take by mouth.     OVER THE COUNTER MEDICATION      sertraline (ZOLOFT) 100 MG tablet      tirzepatide  (MOUNJARO ) 7.5 MG/0.5ML Pen Inject 7.5 mg into the skin once a week. 2 mL 5   valACYclovir (VALTREX) 1000 MG tablet      No current facility-administered medications on file prior to visit.   No Known Allergies   FH: - see HPI  PE: BP 118/64   Pulse 82   Ht 5' 2 (1.575 m)   Wt 119 lb 6.4 oz (54.2 kg)   SpO2 98%   BMI 21.84 kg/m  Wt Readings from Last 3 Encounters:  11/25/23 119 lb 6.4 oz (54.2 kg)  07/26/23 120 lb 9.6 oz (54.7 kg)  11/30/22 123 lb (55.8 kg)   Constitutional: normal weight, in NAD Eyes:  EOMI, no exophthalmos ENT: no neck masses, no cervical lymphadenopathy Cardiovascular: Tachycardia, RR, No MRG Respiratory: CTA B Musculoskeletal: no deformities Skin:no rashes Neurological: no tremor with outstretched hands  ASSESSMENT: 1. DM1, uncontrolled, with long-term complications, with hyper and hypoglycemia - + mild DR  2. HL   3.  History of overweight  No FH of MTC, no personal h/o pancreatitis.  PLAN:  1. Patient with very difficult to control type 1 diabetes, previously improved when she was on weight watchers diet but then worsened control when she came off the  diet.  She tried the OmniPod 5 in the past but this did not work well for her due to differences in insulin  infusion rates with takeoff and landing, as she is a financial controller.  At last visit she was on the mobi tandem pump.  She felt that this was working well for her.  At last visit HbA1c was lower, at 8.4%.  We did decrease her Mounjaro  dose at that time -We discussed that previous visits about her referral to [facility] to be evaluated for islet cell transplant.  She did not go through with this.  At last visit we also discussed about the full pancreatic transplant and discussed about differences between the 2 and possible outcomes in terms of insulin  dose reduction and even insulin  independence.  We also discussed about the need for immune suppression.  She was open to this option so I advised her to look into this and let me know if she wanted me to place the referral again. -At last visit sugars were less fluctuating than before, with less lows, but they were increasing after meals as she was not introducing enough carbs into the pump.  She was afraid that she may drop her blood sugar too low if she entered all of the carbs.  At that time, we strengthened her insulin  to carb ratios but increased the basal rates.  I also recommended doing to always boluses.  I recommended to start with 70-30% extended over 2 hours and change from there.  She definitely needed more training about her type 1 diabetes I recommended an excellent look for this and also recommended to see the diabetes educator between our appointments. -At today's visit, she is off the insulin  pump due to obstructed infusion sites as she feels that she has too much scar tissue on the abdomen.  She has tried to rotate the  sites but without much success.  She did not try other infusion sites or sets. CGM interpretation: -At today's visit, we reviewed her CGM downloads: It appears that 10% of values are in target range (goal >70%), while 90%  are higher than 180 (goal <25%), and 0% are lower than 70 (goal <4%).  The calculated average blood sugar is 321.  The projected HbA1c for the next 3 months (GMI) is 11%. -Reviewing the CGM trends, sugars are extremely high.  Upon questioning, she is taking lower amount of basal and bolus insulin  than recommended, likely the reason for the blood sugars being so high.  We discussed about increasing these (Lantus  increased to 9 units twice a day) and Lyumjev  ICR strengthened to 1: 14 for all meals.  However, my suggestion would be to go back on the insulin  pump ASAP.  We discussed about using other sites for infusion and at today's visit we had the diabetes educator, Rock Dasen, discussed with her about how to use a rigid infusion sets and she gave her samples of these.  I did not recommend a change in her pump settings since at last visit sugars were much better and we also adjusted these at that time. -  I advised her to: Patient Instructions  Please change: - Lantus  9 units 2x a day - Lyumjev  1:14 for all meals  Target: 120 Insulin  sensitivity factor (correction factor): 70  Try to restart the pump ASAP.  Use the following pump settings: - basal rates: 12 am: 0.5 8 am: 0.5 - ICR: 12 am: 1:15 (for high carb+ high fat meals - try a dual wave bolus - start with 70:30%) - target: 110 - ISF: 95 - Active Insulin  Time: 4h  Please enter all carbs into the pump at the beginning of the meal.  Consider using the exercise mode when you are more active. You may also need to relax the ICRs (maybe 1:17).  Also continue: - Mounjaro  5 mg weekly.  Please return in 3 months.  - we checked her HbA1c: 10.3% (higher) - advised to check sugars at different times of the day - 4x a day, rotating check times - advised for yearly eye exams >> she is UTD - at last visit, she had elevated AST and ALT.  I advised her to not use Tylenol, alcohol, and decrease the fat amount in her foods but also recommended  to discuss with PCP about this. - return to clinic in 3-4 months  2. HL - Reviewed latest lipid panel from last visit: Fractions at goal with only a slightly elevated LDL: Lab Results  Component Value Date   CHOL 173 07/26/2023   HDL 72.30 07/26/2023   LDLCALC 86 07/26/2023   TRIG 71.0 07/26/2023   CHOLHDL 2 07/26/2023  -She continues on Lipitor 20 mg daily without side effects  3.  History of overweight -Now out of the in the overweight category based on BMI -She continues on Mounjaro , we decreased the dose at last visit to 5 mg weekly  Lela Fendt, MD PhD [facility] Endocrinology

## 2023-11-25 NOTE — Addendum Note (Signed)
 Addended by: Pollie Meyer on: 11/25/2023 10:08 AM   Modules accepted: Orders

## 2023-11-26 ENCOUNTER — Encounter: Payer: Self-pay | Admitting: Internal Medicine

## 2023-12-31 LAB — HM DIABETES EYE EXAM

## 2024-01-15 ENCOUNTER — Other Ambulatory Visit: Payer: Self-pay | Admitting: Sports Medicine

## 2024-01-15 DIAGNOSIS — M810 Age-related osteoporosis without current pathological fracture: Secondary | ICD-10-CM | POA: Insufficient documentation

## 2024-01-22 ENCOUNTER — Telehealth: Payer: Self-pay | Admitting: Pharmacy Technician

## 2024-01-22 NOTE — Telephone Encounter (Addendum)
 Auth Submission: APPROVED Site of care: Site of care: [facility] Payer: [employer] Medication & CPT/J Code(s) submitted: Evenity (Romosozumab) [id number] Route of submission (phone, fax, portal):  Phone # Fax # Auth type: Buy/Bill PB Units/visits requested: 12 DOSES Reference number: [id number]  Approval from: 01/15/24 to 01/14/25     Co-pay pending: approved

## 2024-02-26 ENCOUNTER — Ambulatory Visit: Payer: Commercial Managed Care - PPO | Admitting: Internal Medicine

## 2024-03-17 ENCOUNTER — Ambulatory Visit

## 2024-03-17 VITALS — BP 121/82 | HR 81 | Temp 98.5°F | Resp 16 | Ht 61.0 in | Wt 118.8 lb

## 2024-03-17 DIAGNOSIS — M810 Age-related osteoporosis without current pathological fracture: Secondary | ICD-10-CM | POA: Diagnosis not present

## 2024-03-17 MED ORDER — ROMOSOZUMAB-AQQG 105 MG/1.17ML ~~LOC~~ SOSY
210.0000 mg | PREFILLED_SYRINGE | Freq: Once | SUBCUTANEOUS | Status: AC
  Administered 2024-03-17: 210 mg via SUBCUTANEOUS
  Filled 2024-03-17: qty 2.34

## 2024-03-17 NOTE — Progress Notes (Signed)
 Diagnosis: Osteoporosis  Provider:  Mannam, Praveen MD  Procedure: Injection  Evenity (Romosozumab-aqqg), Dose: 210 mg, Site: subcutaneous, Number of injections: 2  Injection Site(s): Left lower quad. abdomen and Right lower quad. abdomne  Post Care: Observation period completed  Discharge: Condition: Good, Destination: Home . AVS Declined  Performed by:  Lauran Pollard, LPN

## 2024-04-14 ENCOUNTER — Ambulatory Visit (INDEPENDENT_AMBULATORY_CARE_PROVIDER_SITE_OTHER)

## 2024-04-14 VITALS — BP 122/79 | HR 95 | Temp 98.3°F | Resp 16 | Ht 61.5 in | Wt 122.8 lb

## 2024-04-14 DIAGNOSIS — M810 Age-related osteoporosis without current pathological fracture: Secondary | ICD-10-CM

## 2024-04-14 MED ORDER — ROMOSOZUMAB-AQQG 105 MG/1.17ML ~~LOC~~ SOSY
210.0000 mg | PREFILLED_SYRINGE | Freq: Once | SUBCUTANEOUS | Status: AC
  Administered 2024-04-14: 210 mg via SUBCUTANEOUS

## 2024-04-14 NOTE — Progress Notes (Signed)
 Diagnosis: Osteoporosis  Provider:  Mannam, Praveen MD  Procedure: Injection  Evenity  (Romosozumab -aqqg), Dose: 210 mg, Site: subcutaneous, Number of injections: 2  Injection Site(s): Left lower quad. abdomen  Post Care: Patient declined observation  Discharge: Condition: Good, Destination: Home . AVS Declined  Performed by:  Rachelle Bue, RN

## 2024-05-12 ENCOUNTER — Ambulatory Visit

## 2024-05-14 ENCOUNTER — Ambulatory Visit: Admitting: *Deleted

## 2024-05-14 VITALS — BP 121/77 | HR 84 | Temp 98.3°F | Resp 14 | Ht 61.5 in | Wt 124.6 lb

## 2024-05-14 DIAGNOSIS — M810 Age-related osteoporosis without current pathological fracture: Secondary | ICD-10-CM | POA: Diagnosis not present

## 2024-05-14 MED ORDER — ROMOSOZUMAB-AQQG 105 MG/1.17ML ~~LOC~~ SOSY
210.0000 mg | PREFILLED_SYRINGE | Freq: Once | SUBCUTANEOUS | Status: AC
Start: 2024-05-14 — End: 2024-05-14
  Administered 2024-05-14: 210 mg via SUBCUTANEOUS
  Filled 2024-05-14: qty 2.34

## 2024-05-14 NOTE — Progress Notes (Signed)
 Diagnosis: Osteoporosis  Provider:  Mannam, Praveen MD  Procedure: Injection  Evenity  (Romosozumab -aqqg), Dose: 210 mg, Site: subcutaneous, Number of injections: 2  Injection Site(s): Left upper quad. abdomen  Post Care: Observation period completed  Discharge: Condition: Good, Destination: Home . AVS Declined  Performed by:  Trudy Lamarr LABOR, RN

## 2024-05-14 NOTE — Progress Notes (Deleted)
121/77

## 2024-06-09 ENCOUNTER — Ambulatory Visit

## 2024-06-12 ENCOUNTER — Ambulatory Visit

## 2024-06-15 ENCOUNTER — Ambulatory Visit: Admitting: *Deleted

## 2024-06-15 VITALS — BP 113/73 | HR 84 | Temp 98.8°F | Resp 16 | Ht 62.0 in | Wt 129.4 lb

## 2024-06-15 DIAGNOSIS — M810 Age-related osteoporosis without current pathological fracture: Secondary | ICD-10-CM | POA: Diagnosis not present

## 2024-06-15 MED ORDER — ROMOSOZUMAB-AQQG 105 MG/1.17ML ~~LOC~~ SOSY
210.0000 mg | PREFILLED_SYRINGE | Freq: Once | SUBCUTANEOUS | Status: AC
Start: 2024-06-15 — End: 2024-06-15
  Administered 2024-06-15: 210 mg via SUBCUTANEOUS
  Filled 2024-06-15: qty 2.34

## 2024-06-15 NOTE — Progress Notes (Signed)
 Diagnosis: Osteoporosis  Provider:  Mannam, Praveen MD  Procedure: Injection  Evenity  (Romosozumab -aqqg), Dose: 210 mg, Site: subcutaneous, Number of injections: 2  Injection Site(s): Left upper quad. abdomen  Post Care: Observation period completed  Discharge: Condition: Good, Destination: Home . AVS Declined  Performed by:  Trudy Lamarr LABOR, RN

## 2024-06-25 ENCOUNTER — Other Ambulatory Visit: Payer: Self-pay

## 2024-06-25 DIAGNOSIS — R7989 Other specified abnormal findings of blood chemistry: Secondary | ICD-10-CM

## 2024-06-29 ENCOUNTER — Other Ambulatory Visit: Payer: Self-pay | Admitting: Family Medicine

## 2024-06-29 DIAGNOSIS — R55 Syncope and collapse: Secondary | ICD-10-CM

## 2024-07-01 ENCOUNTER — Ambulatory Visit
Admission: RE | Admit: 2024-07-01 | Discharge: 2024-07-01 | Disposition: A | Discharge status: Home / Self Care | Source: Ambulatory Visit

## 2024-07-01 DIAGNOSIS — R7989 Other specified abnormal findings of blood chemistry: Secondary | ICD-10-CM

## 2024-07-07 ENCOUNTER — Ambulatory Visit

## 2024-07-07 ENCOUNTER — Ambulatory Visit: Admitting: Internal Medicine

## 2024-07-07 ENCOUNTER — Encounter: Payer: Self-pay | Admitting: Internal Medicine

## 2024-07-07 VITALS — BP 120/60 | HR 97 | Ht 62.0 in | Wt 133.6 lb

## 2024-07-07 DIAGNOSIS — E1069 Type 1 diabetes mellitus with other specified complication: Secondary | ICD-10-CM

## 2024-07-07 DIAGNOSIS — E1065 Type 1 diabetes mellitus with hyperglycemia: Secondary | ICD-10-CM

## 2024-07-07 DIAGNOSIS — E785 Hyperlipidemia, unspecified: Secondary | ICD-10-CM

## 2024-07-07 LAB — POCT GLYCOSYLATED HEMOGLOBIN (HGB A1C): Hemoglobin A1C: 11.1 % — AB (ref 4.0–5.6)

## 2024-07-07 NOTE — Progress Notes (Signed)
 Patient ID: Brooke Shah, female   DOB: [birth year], 58 y.o.   MRN: [id number]  HPI: Brooke Shah is a 58 y.o.-year-old female, returning for follow-up for DM1, dx 2004, uncontrolled, with complications (+ mild DR). Last visit 7 months ago.  Interim history: Has no blurry vision, nausea. Since last, she was started on romosozumab  for osteoporosis. Her insurance did not approve Mounjaro , but she obtained it from a health spa >> now off 2/2 wt loss to 111 lbs.  DM1: Insulin  pump: -She started to use an insulin  pump in 2006. -She had a Revel 523 Medtronic pump since 03/2013. -Afterwards, she was on the 530 G Medtronic since 08/2017 (in warranty until 10/2017 as her old pump broke). She did not like the pump and we tried to change to an Omnipod but this was not approved even after peer to peer review.    -She was finally able to start an OmniPod DASH pump in 2020  -At our visit from 07/2021, she was on basal-bolus insulin  regimen -continues to use insulin  when flying -She started the OmniPod 5 08/2021, but she used this intermittently -then stopped -started Mobi Tandem insulin  pump 02/2023  -came off in 09/2023 2/2 scar tissue and obstructed infusion sites -restarted mobi >> off again 03/2024  CGM: -She initially had a Dexcom CGM, then a freestyle libre CGM.  She developed an allergy to the adhesive for the [facility] but she then thought it could have been an allergy to her uniform.  However, her insurance stopped covering the CGM and I had to write a letter for her insurance. This did not help and she afterwards found out that the CGM was covered, but this was expensive due to her high deductible.  However, then she met her deductible and restarted the CGM before last visit.   -Previously off the sensor for almost a month due to lack of supplies  -prev. On Dexcom G6 -now on Dexcom G7 CGM  Supplies: -[place]  Insulin : -Previously on Humalog  -now on Lyumjev  -she did not feel any  difference after switching to this  Reviewed HbA1c levels: Lab Results  Component Value Date   HGBA1C 10.3 (A) 11/25/2023   HGBA1C 8.4 (A) 07/26/2023   HGBA1C 9.0 (A) 03/26/2022  11/07/2022: HbA1c 9.5% 10/16/2020: HbA1c 9.2% 06/24/2017: HbA1c 8.9%  05/31/2016: HbA1c 8.6% 01/11/2016: HbA1c 8.7% 09/29/2015: HbA1c 8.9% 09/28/2014: HbA1c 9.8%  Regimen: Pump on: - basal rates: 12 am: 0.45 >>  0.65 >> 0.20 >> 0.50 8 am: 0.45 >> 0.85 >> 0.30 >> 0.50 - ICR: 12 am: 1:10  (for high carb+ high fat meals - try a dual wave bolus) >> 1:18 >> 1: 15 - target: 120 >> 110 - ISF: 70 >> 95 - Active Insulin  Time: 4h  TDD from basal insulin :  51% (8 units) >> 8 units (46%) TDD from bolus insulin :49% (7.5 units) >> 9.4 units (54%) Total daily dose: Up to 60 units a day >> 18-30 units - changes infusion site: Every 3 days  Pump off: - Lantus  14 >> 16 >> but actually using only 9 units in am! >> 9 units 2x a day >> 8 units in am and 6 units at night >> 14 units at night - Lyumjev /Fiasp:  Insulin  to carb ratio: 1:17 >> 1:14 >> actually using only 1:17 >> 1:15 Target: 120 Insulin  sensitivity factor (correction factor): 70   Also Ozempic  1 mg weekly >> Mounjaro  7.5 mg weekly >> stopped >> back on Mounjaro  7.5 mg weekly (  from a wt loss clinic) >> 5 mg weekly She had yeast infections in the past on Metformin .  She checks her blood sugars >4 times a day with her CGM:  Prev.:  Previously:   Lowest sugar was 49 >> 41 >> 53 >> 41; she has hypoglycemia awareness in the 70s.  No previous hypoglycemia admissions.  She has a glucagon  kit at home. Highest sugar was 441 >> HI >> HI >> HI.  No previous DKA admissions.  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   -No CKD: Last BUN/creatinine:  05/28/2024: 14/0.71, glucose 343 03/16/2024: GFR 102 Lab Results  Component Value Date   BUN 12 07/26/2023   BUN 13 04/06/2022   CREATININE 0.70 07/26/2023   CREATININE 0.64 04/06/2022    No results found for: MICRALBCREAT  -+ HL; last set of lipids: Lab Results  Component Value Date   CHOL 173 07/26/2023   HDL 72.30 07/26/2023   LDLCALC 86 07/26/2023   TRIG 71.0 07/26/2023   CHOLHDL 2 07/26/2023  On Lipitor 20.  - last eye exam was 12/31/2023: + DR.  - she has numbness and tingling in her feet.  Last foot exam 07/26/2023.  Latest TSH was normal: 01/02/2024: TSH 0.71 Lab Results  Component Value Date   TSH 0.99 07/26/2023  09/09/2017: TSH 1.18 10/04/2015: TSH 0.780, free T4 0.90  She has a history of hyperkalemia. She was previously on spironolactone but potassium increased so she had to stop.  Afterwards, she was back on it >> potassium increased to 5.8% 10/16/2020.  After stopping again spironolactone, potassium level returned normal, but at last check this was again slightly elevated: 05/28/2024: 5.2 Lab Results  Component Value Date   K 4.7 07/26/2023   K 5.0 04/06/2022   K 5.6 (H) 03/26/2022   K 4.4 01/04/2021   K 5.2 03/19/2019   K 4.8 03/18/2018   K 5.0 05/31/2016   K 5.6 (H) 01/13/2010  10/16/2020: Potassium 5.8  She had a low bone density and was suggested to go back to hormone replacement.  She would not want to restart this.  In the past, I referred her to [facility] for consideration for islet cell transplant.  However, she did not schedule the patient education class.  ROS: + see HPI  I reviewed pt's medications, allergies, PMH, social hx, family hx, and changes were documented in the history of present illness. Otherwise, unchanged from my initial visit note.  PMH: - DM1 - HL - depression   Past Surgical History:  Procedure Laterality Date   ENDOMETRIAL ABLATION     TUBAL LIGATION Bilateral     Social History   Social History   Marital status: Married    Spouse name: N/A   children: yes   Occupational History   Flight attendant   Social History Main Topics   Smoking status: Never Smoker   Smokeless tobacco: Not on  file   Alcohol use No   Drug use: No   Current Outpatient Medications on File Prior to Visit  Medication Sig Dispense Refill   Accu-Chek Softclix Lancets lancets Use to check glucose 6 times a day 600 each 3   atorvastatin  (LIPITOR) 20 MG tablet 1 tablet Orally Once a day for 90 days     Blood Glucose Monitoring Suppl (ACCU-CHEK GUIDE) w/Device KIT Use to check glucose 6 times a day 1 kit 0   fluconazole  (DIFLUCAN ) 150 MG tablet TAKE ONE TAB BY  MOUTH SINGLE DOSE 1 tablet 1   GLOBAL EASE INJECT PEN NEEDLES 32G X 4 MM MISC USE FOUR times a DAY 200 each 1   Glucagon  3 MG/DOSE POWD Place 3 mg into the nose once as needed for up to 1 dose. 1 each 11   glucose blood (ACCU-CHEK GUIDE TEST) test strip Use to check glucose 6 times a day 600 each 3   Insulin  Aspart, w/Niacinamide, (FIASP) 100 UNIT/ML SOLN Inject under skin up to 50 units a day in the insulin  pump 50 mL 3   Insulin  Lispro-aabc (LYUMJEV  KWIKPEN) 100 UNIT/ML KwikPen inject 10 units THREE TIMES DAILY UNDER SKIN AS DIRECTED (max 30 units daily) 15 mL 5   LANTUS  SOLOSTAR 100 UNIT/ML Solostar Pen inject 20 units into THE SKIN DAILY 15 mL 5   magnesium oxide (MAG-OX) 400 MG tablet Take 400 mg by mouth daily.     Nutritional Supplements (JUICE PLUS FIBRE PO) Take by mouth.     OVER THE COUNTER MEDICATION      sertraline (ZOLOFT) 100 MG tablet      tirzepatide  (MOUNJARO ) 5 MG/0.5ML Pen Inject 5 mg into the skin once a week.     valACYclovir (VALTREX) 1000 MG tablet      No current facility-administered medications on file prior to visit.   No Known Allergies   FH: - see HPI  PE: BP 120/60   Pulse 97   Ht 5' 2 (1.575 m)   Wt 133 lb 9.6 oz (60.6 kg)   SpO2 98%   BMI 24.44 kg/m  Wt Readings from Last 3 Encounters:  07/07/24 133 lb 9.6 oz (60.6 kg)  06/15/24 129 lb 6.4 oz (58.7 kg)  05/14/24 124 lb 9.6 oz (56.5 kg)   Constitutional: normal weight, in NAD Eyes:  EOMI, no exophthalmos ENT: no neck masses, no cervical  lymphadenopathy Cardiovascular: Tachycardia, RR, No MRG Respiratory: CTA B Musculoskeletal: no deformities Skin:no rashes Neurological: no tremor with outstretched hands Diabetic Foot Exam - Simple   Simple Foot Form Diabetic Foot exam was performed with the following findings: Yes 07/07/2024  2:10 PM  Visual Inspection No deformities, no ulcerations, no other skin breakdown bilaterally: Yes Sensation Testing Intact to touch and monofilament testing bilaterally: Yes Pulse Check Posterior Tibialis and Dorsalis pulse intact bilaterally: Yes Comments    ASSESSMENT: 1. DM1, uncontrolled, with long-term complications, with hyper and hypoglycemia - + mild DR  2. HL   3.  History of overweight  No FH of MTC, no personal h/o pancreatitis.  PLAN:  1. Patient with a very difficult to control type 1 diabetes, previously improved when she was on the weight watchers diet but then worsened control after she came off the diet and even worse control after coming off the insulin  pump.  She tried the OmniPod 5 in the past but this did not work well for her due to differences in insulin  infusion rates with takeoff and landing, as she is a Financial controller.  Afterwards, she tried to mobi tandem insulin  pump and she felt that this was working well for her, but she was off the pump at last visit due to obstructed infusion site possibly due to excessive scar tissue on her abdomen.  She tried to rotate the sites but without much success.  She did not try other infusion sets or sites. - At last visit, HbA1c was very high, at 10.3%, and sugars were also very high.  I increased her insulin  doses but also recommended  to start back on an insulin  pump ASAP.  I recommended rigid infusion sets and gave her samples for these. CGM interpretation: -At today's visit, we reviewed her CGM downloads: It appears that 18% of values are in target range (goal >70%), while 81% are higher than 180 (goal <25%), and 1% are lower  than 70 (goal <4%).  The calculated average blood sugar is 291.  The projected HbA1c for the next 3 months (GMI) is 10.3%. -Reviewing the CGM trends, sugars remain very high, increasing tremendously after every meal, in a stepwise fashion.  They are then dropping overnight, quite abruptly, most of the times from the high 300s to 70s or lower.  Upon questioning, she is taking higher dose of Lantus  than recommended at bedtime.  I believe that this may be the reason for her abrupt blood sugars drops overnight, especially since she is not correcting high blood sugars in the middle of the night.  We discussed about trying to move the Lantus  dose in the morning and I will say recommended to increase the dose slightly since sugars are still high.  She also needs more insulin  for meals.  She is using a 1:15 insulin  to carb ratio and this is not enough for her.  I recommended to strengthen the ICR to 1: 12. - At today's visit we discussed again about the absolute importance of starting back on the pump.  She is reticent to attach it due to hypoglycemia with taking off the hyperglycemia with planning.  However, I explained that for all the rest of the time, she would greatly benefit from the pump.  I actually recommended the islet insulin  pump.  I am not sure if she can obtain this since she is still in warranty with her mobile pump.  We will try to discuss with the [employer] representative.  However, if we cannot obtain this, I recommended to go back to the movie pump.  We contacted the previous settings, but unsure if these will need to be adjusted after she starts. -At today's visit we also discussed about an islet transplant, but this would be a last resort due to the current need for immunosuppression.  However, if she cannot start the pump or is not benefiting from the pump, we will need to have another discussion about possibly trying this. -  I advised her to: Patient Instructions  Try to restart the pump  ASAP. We will try to obtain the iLET for you.  Use the following mobi pump settings: - basal rates: 12 am: 0.5 8 am: 0.5 - ICR: 12 am: 1:15 (for high carb+ high fat meals - try a dual wave bolus - start with 70:30%) - target: 110 - ISF: 95 - Active Insulin  Time: 4h  Fot the iLET pump, you only need the weight.  Please enter all carbs into the pump at the beginning of the meal.  For now, change: - Lantus  16 units in am - Lyumjev /Fiasp  ICR 1:12 before meals Target: 120 Insulin  sensitivity factor (correction factor): 70  Please return in 3 months.  - we checked her HbA1c: 11.1% (very high) - advised to check sugars at different times of the day - 4x a day, rotating check times - advised for yearly eye exams >> she is UTD - will check an ACR point - return to clinic in 3 months  2. HL - Lipid panel reviewed from 07/2023: LDL above our target of less than 70, otherwise fractions at goal: Lab  Results  Component Value Date   CHOL 173 07/26/2023   HDL 72.30 07/26/2023   LDLCALC 86 07/26/2023   TRIG 71.0 07/26/2023   CHOLHDL 2 07/26/2023  -She continues on Lipitor 20 mg daily without side effects  3.  History of overweight - out of the in the overweight category based on BMI - She was on Mounjaro  5 mg weekly, but she lost a lot of weight, down to 111 pounds and she had to stop.  At today's visit, her weight is better, at 133 pounds.  For now, I did not recommend to go back to the Mounjaro .  Lela Fendt, MD PhD [facility]

## 2024-07-07 NOTE — Patient Instructions (Addendum)
 Try to restart the pump ASAP. We will try to obtain the iLET for you.  Use the following mobi pump settings: - basal rates: 12 am: 0.5 8 am: 0.5 - ICR: 12 am: 1:15 (for high carb+ high fat meals - try a dual wave bolus - start with 70:30%) - target: 110 - ISF: 95 - Active Insulin  Time: 4h  Fot the iLET pump, you only need the weight.  Please enter all carbs into the pump at the beginning of the meal.  For now, change: - Lantus  16 units in am - Lyumjev /Fiasp  ICR 1:12 before meals Target: 120 Insulin  sensitivity factor (correction factor): 70  Please return in 3 months.

## 2024-07-07 NOTE — Addendum Note (Signed)
 Addended by: CLEOTILDE ROLIN RAMAN on: 07/07/2024 02:50 PM   Modules accepted: Orders

## 2024-07-08 ENCOUNTER — Ambulatory Visit: Payer: Self-pay | Admitting: Internal Medicine

## 2024-07-08 LAB — MICROALBUMIN / CREATININE URINE RATIO
Creatinine, Urine: 97 mg/dL (ref 20–275)
Microalb Creat Ratio: 2 mg/g{creat} (ref <30)
Microalb, Ur: 0.2 mg/dL

## 2024-07-10 ENCOUNTER — Ambulatory Visit

## 2024-07-14 ENCOUNTER — Ambulatory Visit (INDEPENDENT_AMBULATORY_CARE_PROVIDER_SITE_OTHER)

## 2024-07-14 VITALS — BP 124/84 | HR 80 | Temp 98.2°F | Resp 14 | Ht 62.0 in | Wt 133.8 lb

## 2024-07-14 DIAGNOSIS — M810 Age-related osteoporosis without current pathological fracture: Secondary | ICD-10-CM

## 2024-07-14 MED ORDER — ROMOSOZUMAB-AQQG 105 MG/1.17ML ~~LOC~~ SOSY
210.0000 mg | PREFILLED_SYRINGE | Freq: Once | SUBCUTANEOUS | Status: AC
  Administered 2024-07-14: 210 mg via SUBCUTANEOUS
  Filled 2024-07-14: qty 2.34

## 2024-07-14 NOTE — Progress Notes (Signed)
 Diagnosis: Osteoporosis  Provider:  Chilton Greathouse MD  Procedure: Injection  Evenity (Romosozumab-aqqg), Dose: 210 mg, Site: subcutaneous, Number of injections: 2  Injection Site(s): Left arm and Right arm  Post Care:  left and right arm injection  Discharge: Condition: Good, Destination: Home . AVS Declined  Performed by:  Rico Ala, LPN

## 2024-07-22 ENCOUNTER — Encounter: Payer: Self-pay | Admitting: Family Medicine

## 2024-07-24 ENCOUNTER — Ambulatory Visit
Admission: RE | Admit: 2024-07-24 | Discharge: 2024-07-24 | Disposition: A | Discharge status: Home / Self Care | Source: Ambulatory Visit | Attending: Family Medicine | Admitting: Family Medicine

## 2024-07-24 DIAGNOSIS — R55 Syncope and collapse: Secondary | ICD-10-CM

## 2024-07-24 MED ORDER — GADOPICLENOL 0.5 MMOL/ML IV SOLN
6.0000 mL | Freq: Once | INTRAVENOUS | Status: AC | PRN
Start: 2024-07-24 — End: 2024-07-24
  Administered 2024-07-24: 6 mL via INTRAVENOUS

## 2024-08-11 ENCOUNTER — Ambulatory Visit (INDEPENDENT_AMBULATORY_CARE_PROVIDER_SITE_OTHER)

## 2024-08-11 VITALS — BP 138/87 | HR 86 | Temp 98.1°F | Resp 16 | Ht 62.0 in | Wt 138.2 lb

## 2024-08-11 DIAGNOSIS — M810 Age-related osteoporosis without current pathological fracture: Secondary | ICD-10-CM

## 2024-08-11 MED ORDER — ROMOSOZUMAB-AQQG 105 MG/1.17ML ~~LOC~~ SOSY
210.0000 mg | PREFILLED_SYRINGE | Freq: Once | SUBCUTANEOUS | Status: AC
  Administered 2024-08-11: 210 mg via SUBCUTANEOUS
  Filled 2024-08-11: qty 2.34

## 2024-08-11 NOTE — Progress Notes (Signed)
 Diagnosis: Osteoporosis  Provider:  Mannam, Praveen MD  Procedure: Injection  Evenity  (Romosozumab -aqqg), Dose: 210 mg, Site: subcutaneous, Number of injections: 2  Injection Site(s): Left lower quad. abdomen and Right lower quad. abdomne  Post Care: left and right lower abdominal injections  Discharge: Condition: Good, Destination: Home . AVS Declined  Performed by:  Maximiano JONELLE Pouch, LPN

## 2024-09-07 ENCOUNTER — Ambulatory Visit

## 2024-09-07 VITALS — BP 127/81 | HR 82 | Temp 97.7°F | Resp 18 | Ht 62.0 in | Wt 137.0 lb

## 2024-09-07 DIAGNOSIS — M810 Age-related osteoporosis without current pathological fracture: Secondary | ICD-10-CM | POA: Diagnosis not present

## 2024-09-07 MED ORDER — ROMOSOZUMAB-AQQG 105 MG/1.17ML ~~LOC~~ SOSY
210.0000 mg | PREFILLED_SYRINGE | Freq: Once | SUBCUTANEOUS | Status: AC
  Administered 2024-09-07: 210 mg via SUBCUTANEOUS
  Filled 2024-09-07: qty 2.34

## 2024-09-07 NOTE — Progress Notes (Signed)
 Diagnosis: Osteoporosis  Provider:  Mannam, Praveen MD  Procedure: Injection  Evenity  (Romosozumab -aqqg), Dose: 210 mg, Site: subcutaneous, Number of injections: 2  Injection Site(s): Left lower quad. abdomen and Right lower quad. abdomne  Post Care:  left and right lower quad abdominal injections  Discharge: Condition: Good, Destination: Home . AVS Declined  Performed by:  Lauran Pollard, LPN

## 2024-09-08 ENCOUNTER — Ambulatory Visit

## 2024-10-06 ENCOUNTER — Ambulatory Visit (INDEPENDENT_AMBULATORY_CARE_PROVIDER_SITE_OTHER)

## 2024-10-06 VITALS — BP 126/76 | HR 91 | Temp 98.0°F | Resp 18 | Ht 62.0 in | Wt 137.8 lb

## 2024-10-06 DIAGNOSIS — M810 Age-related osteoporosis without current pathological fracture: Secondary | ICD-10-CM | POA: Diagnosis not present

## 2024-10-06 MED ORDER — ROMOSOZUMAB-AQQG 105 MG/1.17ML ~~LOC~~ SOSY
210.0000 mg | PREFILLED_SYRINGE | Freq: Once | SUBCUTANEOUS | Status: AC
  Administered 2024-10-06: 210 mg via SUBCUTANEOUS
  Filled 2024-10-06: qty 2.34

## 2024-10-06 NOTE — Progress Notes (Signed)
 Diagnosis: Oestoporosis  Provider:  Mannam, Praveen MD  Procedure: Injection  Evenity  (Romosozumab -aqqg), Dose: 210 mg, Site: subcutaneous, Number of injections: 2  Injection Site(s): Left lower quad. abdomen  Post Care: Patient declined observation  Discharge: Condition: Good, Destination: Home . AVS Declined  Performed by:  Pascale Maves, RN

## 2024-10-06 NOTE — Progress Notes (Unsigned)
 Patient ID: Brooke Shah, female   DOB: Mar 28, 1966, 58 y.o.   MRN: [id number]  HPI: Brooke Shah is a 58 y.o.-year-old female, returning for follow-up for DM1, dx 2004, uncontrolled, with complications (+ mild DR). Last visit 3 months ago.  Interim history: Has no blurry vision, nausea.  DM1: Insulin  pump: -She started to use an insulin  pump in 2006. -She had a Revel 523 Medtronic pump since 03/2013. -Afterwards, she was on the 530 G Medtronic since 08/2017 (in warranty until 10/2017 as her old pump broke). She did not like the pump and we tried to change to an Omnipod but this was not approved even after peer to peer review.    -She was finally able to start an OmniPod DASH pump in 2020  -At our visit from 07/2021, she was on basal-bolus insulin  regimen -continues to use insulin  when flying -She started the OmniPod 5 08/2021, but she used this intermittently -then stopped -started Mobi Tandem insulin  pump 02/2023  -came off in 09/2023 2/2 scar tissue and obstructed infusion sites -restarted mobi >> off again 03/2024  CGM: -She initially had a Dexcom CGM, then a freestyle libre CGM.  She developed an allergy to the adhesive for the [facility] but she then thought it could have been an allergy to her uniform.  However, her insurance stopped covering the CGM and I had to write a letter for her insurance. This did not help and she afterwards found out that the CGM was covered, but this was expensive due to her high deductible.  However, then she met her deductible and restarted the CGM before last visit.   -Previously off the sensor for almost a month due to lack of supplies  -prev. On Dexcom G6 -now on Dexcom G7 CGM  Supplies: -[place]  Insulin : -Previously on Humalog  -now on Lyumjev  -she did not feel any difference after switching to this  Reviewed HbA1c levels: Lab Results  Component Value Date   HGBA1C 11.1 (A) 07/07/2024   HGBA1C 10.3 (A) 11/25/2023   HGBA1C 8.4 (A)  07/26/2023  11/07/2022: HbA1c 9.5% 10/16/2020: HbA1c 9.2% 06/24/2017: HbA1c 8.9%  05/31/2016: HbA1c 8.6% 01/11/2016: HbA1c 8.7% 09/29/2015: HbA1c 8.9% 09/28/2014: HbA1c 9.8%  Regimen: Mobi pump settings: - basal rates: 12 am: 0.5 8 am: 0.5 - ICR: 12 am: 1:15 (for high carb+ high fat meals - try a dual wave bolus - start with 70:30%) - target: 110 - ISF: 95 - Active Insulin  Time: 4h TDD from basal insulin :  51% (8 units) >> 8 units (46%) TDD from bolus insulin :49% (7.5 units) >> 9.4 units (54%) Total daily dose: Up to 60 units a day >> 18-30 units - changes infusion site: Every 3 days  Pump off: - Lantus  14 >> 16 >> but actually using only 9 units in am! >> 9 units 2x a day >> 8 units in am and 6 units at night >> 14 units at night - Lyumjev /Fiasp:  Insulin  to carb ratio: 1:17 >> 1:14 >> actually using only 1:17 >> 1:15 Target: 120 Insulin  sensitivity factor (correction factor): 70   Also Ozempic  1 mg weekly >> Mounjaro  7.5 mg weekly >> stopped >> back on Mounjaro  7.5 mg weekly (from a wt loss clinic) >> 5 mg weekly She had yeast infections in the past on Metformin .  She checks her blood sugars >4 times a day with her CGM:  Prev.:  Lowest sugar was 41 >> 53 >> 41; she has hypoglycemia awareness in the 70s.  No  previous hypoglycemia admissions.  She has a glucagon  kit at home. Highest sugar was  HI >> HI.  No previous DKA admissions.  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   -No CKD: Last BUN/creatinine:  05/28/2024: 14/0.71, glucose 343 03/16/2024: GFR 102 Lab Results  Component Value Date   BUN 12 07/26/2023   BUN 13 04/06/2022   CREATININE 0.70 07/26/2023   CREATININE 0.64 04/06/2022   Lab Results  Component Value Date   MICRALBCREAT 2 07/07/2024   -+ HL; last set of lipids: Lab Results  Component Value Date   CHOL 173 07/26/2023   HDL 72.30 07/26/2023   LDLCALC 86 07/26/2023   TRIG 71.0 07/26/2023   CHOLHDL 2 07/26/2023  On  Lipitor 20.  - last eye exam was 12/31/2023: + DR.  - she has numbness and tingling in her feet.  Last foot exam 07/07/2024.  Latest TSH was normal: 01/02/2024: TSH 0.71 Lab Results  Component Value Date   TSH 0.99 07/26/2023  09/09/2017: TSH 1.18 10/04/2015: TSH 0.780, free T4 0.90  She had a low bone density and was suggested to go back to hormone replacement.  She did not want to restart this.  Currently on Evenity .  In the past, I referred her to [facility] for consideration for islet cell transplant.  However, she did not schedule the patient education class.  ROS: + see HPI  I reviewed pt's medications, allergies, PMH, social hx, family hx, and changes were documented in the history of present illness. Otherwise, unchanged from my initial visit note.  PMH: - DM1 - HL - depression   Past Surgical History:  Procedure Laterality Date   ENDOMETRIAL ABLATION     TUBAL LIGATION Bilateral     Social History   Social History   Marital status: Married    Spouse name: N/A   children: yes   Occupational History   Flight attendant   Social History Main Topics   Smoking status: Never Smoker   Smokeless tobacco: Not on file   Alcohol use No   Drug use: No   Current Outpatient Medications on File Prior to Visit  Medication Sig Dispense Refill   Accu-Chek Softclix Lancets lancets Use to check glucose 6 times a day 600 each 3   atorvastatin  (LIPITOR) 20 MG tablet 1 tablet Orally Once a day for 90 days     Blood Glucose Monitoring Suppl (ACCU-CHEK GUIDE) w/Device KIT Use to check glucose 6 times a day 1 kit 0   fluconazole  (DIFLUCAN ) 150 MG tablet TAKE ONE TAB BY MOUTH SINGLE DOSE (Patient not taking: Reported on 07/07/2024) 1 tablet 1   GLOBAL EASE INJECT PEN NEEDLES 32G X 4 MM MISC USE FOUR times a DAY 200 each 1   Glucagon  3 MG/DOSE POWD Place 3 mg into the nose once as needed for up to 1 dose. 1 each 11   glucose blood (ACCU-CHEK GUIDE TEST) test strip Use to check  glucose 6 times a day 600 each 3   Insulin  Aspart, w/Niacinamide, (FIASP ) 100 UNIT/ML SOLN Inject under skin up to 50 units a day in the insulin  pump (Patient taking differently: Inject under skin up to 50 units a day in the insulin  pump) 50 mL 3   Insulin  Lispro-aabc (LYUMJEV  KWIKPEN) 100 UNIT/ML KwikPen inject 10 units THREE TIMES DAILY UNDER SKIN AS DIRECTED (max 30 units daily) 15 mL 5   LANTUS  SOLOSTAR 100 UNIT/ML Solostar Pen inject 20 units  into THE SKIN DAILY 15 mL 5   magnesium oxide (MAG-OX) 400 MG tablet Take 400 mg by mouth daily.     Nutritional Supplements (JUICE PLUS FIBRE PO) Take by mouth.     OVER THE COUNTER MEDICATION      Romosozumab -aqqg (EVENITY ) 105 MG/1.17ML SOSY injection Inject 210 mg into the skin once.     sertraline (ZOLOFT) 100 MG tablet      tirzepatide  (MOUNJARO ) 5 MG/0.5ML Pen Inject 5 mg into the skin once a week. (Patient not taking: Reported on 07/07/2024)     valACYclovir (VALTREX) 1000 MG tablet      No current facility-administered medications on file prior to visit.   No Known Allergies   FH: - see HPI  PE: There were no vitals taken for this visit. Wt Readings from Last 3 Encounters:  10/06/24 137 lb 12.8 oz (62.5 kg)  09/07/24 137 lb (62.1 kg)  08/11/24 138 lb 3.2 oz (62.7 kg)   Constitutional: normal weight, in NAD Eyes:  EOMI, no exophthalmos ENT: no neck masses, no cervical lymphadenopathy Cardiovascular: Tachycardia, RR, No MRG Respiratory: CTA B Musculoskeletal: no deformities Skin:no rashes Neurological: no tremor with outstretched hands  ASSESSMENT: 1. DM1, uncontrolled, with long-term complications, with hyper and hypoglycemia - + DR  2. HL   3.  History of overweight  No FH of MTC, no personal h/o pancreatitis.  PLAN:  1. Patient with very difficult to control type 1 diabetes, previously improved when she was on weight watchers but then worsened control after she came off the diet and even worse control after she came  off the insulin  pump. She tried the OmniPod 5 in the past but this did not work well for her due to differences in insulin  infusion rates with takeoff and landing, as she is a financial controller.  Afterwards, she tried to mobi tandem insulin  pump and she felt that this was working well for her, but she came off due to obstructed infusion site possibly due to excessive scar tissue on her abdomen.  She tried to rotate the sites but without much success.  She did not try other infusion sets or sites.  We discussed at previous visits about trying her best to get back on the insulin  pump with rigid infusion sets and we gave her samples of these.  However, at last visit, she was still on a basal/bolus insulin  regimen with very high blood sugars and increasing tremendously after every meal, in a stepwise fashion.  They were then dropping overnight and quite abruptly, mostly from the high 300s to 70s or lower.  Upon questioning, she was taking a higher dose of Lantus  at bedtime the recommended we discussed about trying to move Lantus  to the morning.  Also, he was using a 1: 15 insulin  to carb ratio which I did not feel was enough for her and I recommended to strengthened this to 1: 12.  We again discussed about the absolute importance of starting back on the pump.  She was reticent to attach it due to hypoglycemia with taking off and hyperglycemia with landing.  However, I explained that her all the rest of the time she would still greatly benefit from the pump.  I actually recommended a beta bionic ilet insulin  pump and we contacted the rep about this.  We again addressed the possibility of an islet transplant, but I explained that she would require immunosuppression after this.  However, if not able to start on the pump or  not benefiting from the pump, then I feel that this would be an option for her. CGM interpretation: -At today's visit, we reviewed her CGM downloads: It appears that *** of values are in target range  (goal >70%), while *** are higher than 180 (goal <25%), and *** are lower than 70 (goal <4%).  The calculated average blood sugar is ***.  The projected HbA1c for the next 3 months (GMI) is ***. -Reviewing the CGM trends, *** -  I advised her to: Patient Instructions  Try to restart the pump ASAP. We will try to obtain the iLET for you.  Use the following mobi pump settings: - basal rates: 12 am: 0.5 8 am: 0.5 - ICR: 12 am: 1:15 (for high carb+ high fat meals - try a dual wave bolus - start with 70:30%) - target: 110 - ISF: 95 - Active Insulin  Time: 4h  Fot the iLET pump, you only need the weight.  Please enter all carbs into the pump at the beginning of the meal.  Of the pump: - Lantus  16 units in am - Lyumjev /Fiasp  ICR 1:12 before meals Target: 120 Insulin  sensitivity factor (correction factor): 70  Continue Mounjaro  5 mg weekly.  Please return in 3 months.  - we checked her HbA1c: 7%  - advised to check sugars at different times of the day - 4x a day, rotating check times - advised for yearly eye exams >> she is UTD - return to clinic in 3-4 months  2. HL - Latest lipid panel was at goal except for an LDL above our target of less than 70: Lab Results  Component Value Date   CHOL 173 07/26/2023   HDL 72.30 07/26/2023   LDLCALC 86 07/26/2023   TRIG 71.0 07/26/2023   CHOLHDL 2 07/26/2023  -She continues Lipitor 10 mg daily without side effects  3.  History of overweight - out of the in the overweight category based on BMI - She was on Mounjaro  5 mg weekly, but she lost a lot of weight, down to 111 pounds and she had to stop.  At last visit, her weight was better, at 133 pounds so we continued the same dose of Mounjaro   Brooke Fendt, MD PhD [facility]

## 2024-10-07 ENCOUNTER — Encounter: Payer: Self-pay | Admitting: Internal Medicine

## 2024-10-07 ENCOUNTER — Ambulatory Visit: Admitting: Internal Medicine

## 2024-10-07 VITALS — BP 116/80 | HR 101 | Ht 62.0 in | Wt 138.0 lb

## 2024-10-07 DIAGNOSIS — E1069 Type 1 diabetes mellitus with other specified complication: Secondary | ICD-10-CM

## 2024-10-07 DIAGNOSIS — E785 Hyperlipidemia, unspecified: Secondary | ICD-10-CM | POA: Diagnosis not present

## 2024-10-07 DIAGNOSIS — Z87898 Personal history of other specified conditions: Secondary | ICD-10-CM | POA: Diagnosis not present

## 2024-10-07 DIAGNOSIS — E1065 Type 1 diabetes mellitus with hyperglycemia: Secondary | ICD-10-CM | POA: Diagnosis not present

## 2024-10-07 MED ORDER — OMNIPOD 5 G7 INTRO (GEN 5) KIT: 1.0000 | PACK | Freq: Once | 0 refills | Status: AC

## 2024-10-07 MED ORDER — OMNIPOD 5 G7 PODS (GEN 5) MISC: 1.0000 | 3 refills | Status: DC

## 2024-10-07 NOTE — Patient Instructions (Addendum)
 Try to restart the pump ASAP.   Use the following Omnipod settings: - basal rates: 12 am: 0.5 Max basal rate: 2.5 - ICR: 12 am: 1:10-12 >> 1:8 - target: 110 - ISF: 60 - Active Insulin  Time: 4-5h  Please enter all carbs into the pump at the beginning of the meal.  Off the pump: - Lantus  16 units in am - Lyumjev /Fiasp  ICR 1:12 >> 1:8 before meals Target: 120 Insulin  sensitivity factor (correction factor): 60  Please return in 2 months.

## 2024-10-13 ENCOUNTER — Encounter: Payer: Self-pay | Admitting: Internal Medicine

## 2024-10-13 ENCOUNTER — Telehealth: Payer: Self-pay | Admitting: Dietician

## 2024-10-13 DIAGNOSIS — E1065 Type 1 diabetes mellitus with hyperglycemia: Secondary | ICD-10-CM

## 2024-10-13 MED ORDER — FIASP 100 UNIT/ML IJ SOLN: INTRAMUSCULAR | 3 refills | Status: AC

## 2024-10-13 NOTE — Telephone Encounter (Signed)
 Called patient regarding pump training appointment.  Patient is not available.  Left message for her to call [phone] when she gets her Omnipod pump for a training appointment.  Leita Constable, RD, LDN, CDCES, DipACLM

## 2024-10-13 NOTE — Telephone Encounter (Signed)
 I called and left a detailed message with the patient on her voicemail.

## 2024-10-20 ENCOUNTER — Encounter: Admitting: Nutrition

## 2024-10-20 DIAGNOSIS — E1065 Type 1 diabetes mellitus with hyperglycemia: Secondary | ICD-10-CM | POA: Insufficient documentation

## 2024-10-27 NOTE — Progress Notes (Signed)
 Patient is switching from Mobi to OmniPod5, which she was on before and wanted a refresher on how to use this.  Settings were put into her phone under the OmniPod5 app.12 am: 0.5  Max basal: 2.0  ICR:12 am: 1:10-12, now 1: 8,  target: 110 - ISF: 60 - Active Insulin  Time: 4-5h  She was show how to fill a pod, link the sensor, and give a bolus.  She did these things and had no final questions.

## 2024-10-27 NOTE — Patient Instructions (Signed)
 Change pod every 3 days Call OmniPod help line if questions/problems with the pump

## 2024-11-03 ENCOUNTER — Encounter: Payer: Self-pay | Admitting: Sports Medicine

## 2024-11-03 ENCOUNTER — Ambulatory Visit (INDEPENDENT_AMBULATORY_CARE_PROVIDER_SITE_OTHER)

## 2024-11-03 VITALS — BP 136/84 | HR 86 | Temp 97.9°F | Resp 16 | Ht 62.0 in | Wt 140.8 lb

## 2024-11-03 DIAGNOSIS — M810 Age-related osteoporosis without current pathological fracture: Secondary | ICD-10-CM

## 2024-11-03 MED ORDER — ROMOSOZUMAB-AQQG 105 MG/1.17ML ~~LOC~~ SOSY
210.0000 mg | PREFILLED_SYRINGE | Freq: Once | SUBCUTANEOUS | Status: AC
  Administered 2024-11-03: 210 mg via SUBCUTANEOUS
  Filled 2024-11-03: qty 2.34

## 2024-11-03 NOTE — Addendum Note (Signed)
 Addended by: DAYNE SHERRY RAMAN on: 11/03/2024 11:35 AM   Modules accepted: Orders

## 2024-11-03 NOTE — Progress Notes (Signed)
 Diagnosis: Osteoporosis  Provider:  Mannam, Praveen MD  Procedure: Injection  Evenity  (Romosozumab -aqqg), Dose: 210 mg, Site: subcutaneous, Number of injections: 2  Injection Site(s): Left lower quad. abdomen and Right lower quad. abdomne  Post Care: Patient declined observation  Discharge: Condition: Stable, Destination: Home . AVS Declined  Performed by:  Rocky FORBES Sar, RN

## 2024-12-01 ENCOUNTER — Ambulatory Visit

## 2024-12-01 VITALS — BP 130/81 | HR 89 | Temp 97.7°F | Resp 14 | Ht 62.0 in | Wt 142.4 lb

## 2024-12-01 DIAGNOSIS — M810 Age-related osteoporosis without current pathological fracture: Secondary | ICD-10-CM | POA: Diagnosis not present

## 2024-12-01 MED ORDER — ROMOSOZUMAB-AQQG 105 MG/1.17ML ~~LOC~~ SOSY
210.0000 mg | PREFILLED_SYRINGE | Freq: Once | SUBCUTANEOUS | Status: AC
  Administered 2024-12-01: 210 mg via SUBCUTANEOUS
  Filled 2024-12-01: qty 2.34

## 2024-12-01 NOTE — Progress Notes (Signed)
 Diagnosis:  Osteoporosis  Provider:  Mannam, Praveen MD  Procedure: Injection  Evenity  (Romosozumab -aqqg), Dose: 210 mg, Site: subcutaneous, Number of injections: 2  Injection Site(s): Left lower quad. abdomen and Right lower quad. abdomne  Post Care: left and right lower abdominal quad injections  Discharge: Condition: Good, Destination: Home . AVS Declined  Performed by:  Maximiano JONELLE Pouch, LPN

## 2024-12-02 LAB — LAB REPORT - SCANNED
A1c: 10.6
EGFR: 94

## 2024-12-03 ENCOUNTER — Ambulatory Visit: Payer: Self-pay | Admitting: Internal Medicine

## 2024-12-08 ENCOUNTER — Ambulatory Visit: Admitting: Internal Medicine

## 2024-12-17 ENCOUNTER — Ambulatory Visit: Admitting: Internal Medicine

## 2024-12-17 ENCOUNTER — Encounter: Payer: Self-pay | Admitting: Internal Medicine

## 2024-12-17 VITALS — BP 120/60 | HR 95 | Ht 62.0 in | Wt 144.0 lb

## 2024-12-17 DIAGNOSIS — E1069 Type 1 diabetes mellitus with other specified complication: Secondary | ICD-10-CM

## 2024-12-17 DIAGNOSIS — E1065 Type 1 diabetes mellitus with hyperglycemia: Secondary | ICD-10-CM

## 2024-12-17 DIAGNOSIS — E663 Overweight: Secondary | ICD-10-CM

## 2024-12-17 MED ORDER — ACCU-CHEK SOFTCLIX LANCETS MISC: 3 refills | Status: AC

## 2024-12-17 MED ORDER — ACCU-CHEK GUIDE TEST VI STRP: ORAL_STRIP | 3 refills | Status: AC

## 2024-12-17 MED ORDER — ACCU-CHEK GUIDE W/DEVICE KIT: PACK | 0 refills | Status: AC

## 2024-12-17 NOTE — Patient Instructions (Addendum)
 Use the following mobi settings: - basal rates: 12 am: 0.4 >> 0.5 8 am: 0.5 Max basal rate: 2.5 - ICR: 12 am: 1:14 >> 1:10 - target: 120 - ISF: 70 >> 60 - Active Insulin  Time: 4-5h  Please enter all carbs into the pump at the beginning of the meal.  Please return in 2-3 months.

## 2024-12-17 NOTE — Progress Notes (Signed)
 Patient ID: Brooke Shah, female   DOB: 02-13-1966, 59 y.o.   MRN: [id number]  HPI: Brooke Shah is a 59 y.o.-year-old female, returning for follow-up for DM1, dx 2004, uncontrolled, with complications (+ mild DR). Last visit 2.5 months ago.  Interim history: She denies no blurry vision, nausea, increased urination. She feels she gained weight since last visit.  Per our scale, she gained 8 pounds.  DM1:  Reviewed HbA1c levels: 12/02/2024: HbA1c 10.6% Lab Results  Component Value Date   HGBA1C 11.1 (A) 07/07/2024   HGBA1C 10.3 (A) 11/25/2023   HGBA1C 8.4 (A) 07/26/2023  11/07/2022: HbA1c 9.5% 10/16/2020: HbA1c 9.2% 06/24/2017: HbA1c 8.9%  05/31/2016: HbA1c 8.6% 01/11/2016: HbA1c 8.7% 09/29/2015: HbA1c 8.9% 09/28/2014: HbA1c 9.8%  Insulin  pump: -She started to use an insulin  pump in 2006. -She had a Revel 523 Medtronic pump since 03/2013. -Afterwards, she was on the 530 G Medtronic since 08/2017 (in warranty until 10/2017 as her old pump broke). She did not like the pump and we tried to change to an Omnipod but this was not approved even after peer to peer review.    -She was finally able to start an OmniPod DASH pump in 2020  -At our visit from 07/2021, she was on basal-bolus insulin  regimen -continues to use insulin  when flying -She started the OmniPod 5 08/2021, but she used this intermittently -then stopped -started Mobi Tandem insulin  pump 02/2023  -came off in 09/2023 2/2 scar tissue and obstructed infusion sites -restarted mobi >> off again 03/2024 -restarted Omnipod 5 >> sugars high  -restarted mobi with Tru steel infusion sets  CGM: -She initially had a Dexcom CGM, then a freestyle libre CGM.  She developed an allergy to the adhesive for the [facility] but she then thought it could have been an allergy to her uniform.  However, her insurance stopped covering the CGM and I had to write a letter for her insurance. This did not help and she afterwards found out that  the CGM was covered, but this was expensive due to her high deductible.  However, then she met her deductible and restarted the CGM before last visit.   -Previously off the sensor for almost a month due to lack of supplies  -prev. On Dexcom G6 -now on Dexcom G7 CGM  Supplies: -[place]  Insulin : -Previously on Humalog  -now on Lyumjev  -she did not feel any difference after switching to this  Regimen: Pump settings: - basal rates: 12 am: 0.4 8 am: 0.5 Max basal rate: 2.5 - ICR: 12 am: 1:10-12 >> 1:8 >> 1:14 - target: 110 >> 120 - ISF: 60 >> 70 - Active Insulin  Time: 4h TDD from basal insulin :  51% (8 units) >> 8 units (46%) TDD from bolus insulin :49% (7.5 units) >> 9.4 units (54%) Total daily dose: Up to 60 units a day >> 18-30 units - changes infusion site: Every 3 days  Pump off: - Lantus  16 units in am - Lyumjev /Fiasp   ICR 1:12 Target: 120 Insulin  sensitivity factor (correction factor): 60   Also Ozempic  1 mg weekly >> Mounjaro  7.5 mg weekly >> stopped >> back on Mounjaro  7.5 mg weekly (from a wt loss clinic) >> 5 mg weekly >> off since 02/2024. She had yeast infections in the past on Metformin .  She checks her blood sugars >4 times a day with her CGM:  Previously:  Prev.:  Prev.:  Lowest sugar was 41 >> 53 >> 41; she has hypoglycemia awareness in the 70s.  No previous  hypoglycemia admissions.  She has a glucagon  kit at home. Highest sugar was  HI >> HI >> HI.  No previous DKA admissions.  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   -No CKD: Last BUN/creatinine:  12/08/2023: 12/0.74, GFR 94, glucose 191 05/28/2024: 14/0.71, glucose 343 03/16/2024: GFR 102 Lab Results  Component Value Date   BUN 12 07/26/2023   BUN 13 04/06/2022   CREATININE 0.70 07/26/2023   CREATININE 0.64 04/06/2022   Lab Results  Component Value Date   MICRALBCREAT 2 07/07/2024   -+ HL; last set of lipids: 12/02/2024: 198/68/80/105 Lab Results  Component Value Date    CHOL 173 07/26/2023   HDL 72.30 07/26/2023   LDLCALC 86 07/26/2023   TRIG 71.0 07/26/2023   CHOLHDL 2 07/26/2023  On Lipitor 20.  - last eye exam was 12/31/2023: + DR.  - she has numbness and tingling in her feet.  Last foot exam 07/07/2024.  Latest TSH was normal: 01/02/2024: TSH 0.71 Lab Results  Component Value Date   TSH 0.99 07/26/2023  09/09/2017: TSH 1.18 10/04/2015: TSH 0.780, free T4 0.90  She had a low bone density and was suggested to go back to hormone replacement.  She did not want to restart this.  Currently on Evenity .  In the past, I referred her to [facility] for consideration for islet cell transplant.  However, she did not schedule the patient education class.  ROS: + see HPI  I reviewed pt's medications, allergies, PMH, social hx, family hx, and changes were documented in the history of present illness. Otherwise, unchanged from my initial visit note.  PMH: - DM1 - HL - depression   Past Surgical History:  Procedure Laterality Date   ENDOMETRIAL ABLATION     TUBAL LIGATION Bilateral     Social History   Social History   Marital status: Married    Spouse name: N/A   children: yes   Occupational History   Flight attendant   Social History Main Topics   Smoking status: Never Smoker   Smokeless tobacco: Not on file   Alcohol use No   Drug use: No   Current Outpatient Medications on File Prior to Visit  Medication Sig Dispense Refill   Accu-Chek Softclix Lancets lancets Use to check glucose 6 times a day 600 each 3   atorvastatin  (LIPITOR) 20 MG tablet 1 tablet Orally Once a day for 90 days     Blood Glucose Monitoring Suppl (ACCU-CHEK GUIDE) w/Device KIT Use to check glucose 6 times a day 1 kit 0   fluconazole  (DIFLUCAN ) 150 MG tablet TAKE ONE TAB BY MOUTH SINGLE DOSE 1 tablet 1   GLOBAL EASE INJECT PEN NEEDLES 32G X 4 MM MISC USE FOUR times a DAY 200 each 1   Glucagon  3 MG/DOSE POWD Place 3 mg into the nose once as needed for up to 1  dose. 1 each 11   glucose blood (ACCU-CHEK GUIDE TEST) test strip Use to check glucose 6 times a day 600 each 3   Insulin  Aspart, w/Niacinamide, (FIASP ) 100 UNIT/ML SOLN Inject under skin up to 50 units a day in the insulin  pump 50 mL 3   Insulin  Disposable Pump (OMNIPOD 5 G7 PODS, GEN 5,) MISC 1 each by Does not apply route every 3 (three) days. 30 each 3   Insulin  Lispro-aabc (LYUMJEV  KWIKPEN) 100 UNIT/ML KwikPen inject 10 units THREE TIMES DAILY UNDER SKIN AS DIRECTED (max 30 units daily) 15  mL 5   LANTUS  SOLOSTAR 100 UNIT/ML Solostar Pen inject 20 units into THE SKIN DAILY 15 mL 5   magnesium oxide (MAG-OX) 400 MG tablet Take 400 mg by mouth daily.     Nutritional Supplements (JUICE PLUS FIBRE PO) Take by mouth.     OVER THE COUNTER MEDICATION      Romosozumab -aqqg (EVENITY ) 105 MG/1.17ML SOSY injection Inject 210 mg into the skin once.     sertraline (ZOLOFT) 100 MG tablet      valACYclovir (VALTREX) 1000 MG tablet      No current facility-administered medications on file prior to visit.   No Known Allergies   FH: - see HPI  PE: BP 120/60   Pulse 95   Ht 5' 2 (1.575 m)   Wt 144 lb (65.3 kg)   SpO2 96%   BMI 26.34 kg/m  Wt Readings from Last 20 Encounters:  12/17/24 144 lb (65.3 kg)  12/01/24 142 lb 6.4 oz (64.6 kg)  11/03/24 140 lb 12.8 oz (63.9 kg)  10/07/24 138 lb (62.6 kg)  10/06/24 137 lb 12.8 oz (62.5 kg)  09/07/24 137 lb (62.1 kg)  08/11/24 138 lb 3.2 oz (62.7 kg)  07/14/24 133 lb 12.8 oz (60.7 kg)  07/07/24 133 lb 9.6 oz (60.6 kg)  06/15/24 129 lb 6.4 oz (58.7 kg)  05/14/24 124 lb 9.6 oz (56.5 kg)  04/14/24 122 lb 12.8 oz (55.7 kg)  03/17/24 118 lb 12.8 oz (53.9 kg)  11/25/23 119 lb 6.4 oz (54.2 kg)  07/26/23 120 lb 9.6 oz (54.7 kg)  11/30/22 123 lb (55.8 kg)  03/26/22 125 lb 9.6 oz (57 kg)  11/08/21 126 lb 12.8 oz (57.5 kg)  09/15/21 130 lb 12.8 oz (59.3 kg)  08/09/21 137 lb (62.1 kg)   Constitutional: normal weight, in NAD Eyes:  EOMI, no  exophthalmos ENT: no neck masses, no cervical lymphadenopathy Cardiovascular: RRR, No MRG Respiratory: CTA B Musculoskeletal: no deformities Skin:no rashes Neurological: no tremor with outstretched hands  ASSESSMENT: 1. DM1, uncontrolled, with long-term complications, with hyper and hypoglycemia - + DR  2. HL   3.  Overweight  No FH of MTC, no personal h/o pancreatitis.  PLAN:  1. Patient with extremely difficult to control type 1 diabetes, previously improved when she was on weight watchers but then worsened control after she came off the diet and even worse control after she came off the insulin  pump. - She tried the OmniPod 5 in the past but this did not work well for her due to differences in insulin  infusion rates with takeoff and landing, as she is a financial controller.   - Afterwards, she tried to [facility] tandem insulin  pump and she felt that this was working well for her, but she came off due to obstructed infusion site possibly due to excessive scar tissue on her abdomen.  She tried to rotate the sites but without much success.  She did not try other infusion sets or sites.   - We discussed at previous visits about trying her best to get back on the insulin  pump with rigid infusion sets and we gave her samples of these.   - However, did not restart the pump before last visit.  We again discussed about the absolute importance of starting back on the pump.  She was reticent to attach it due to hypoglycemia with taking off and hyperglycemia with landing.  However, I explained that her all the rest of the time she would still greatly  benefit from the pump.  I actually recommended a beta bionic ilet insulin  pump and we contacted the rep about this.  We again addressed the possibility of an islet transplant, but I explained that she would require immunosuppression after this.  However, if not able to start on the pump or not benefiting from the pump, then I feel that this would be an option for  her. -At last visit, sugars have not changed much from previously, abruptly dropping overnight and increasing throughout the day, with blood sugars mostly in the 300s to HI in the evening.  We discussed that this was in acceptable control and leading to complications.  She was using an insulin  to carb ratio of approximately 1: 10-1: 15 blood sugars remained elevated and she was afraid to give herself more insulin  due to the possibility of lows.  I did recommend to strengthened her insulin  to carb ratio but I did not feel that we could really manage her diabetes very well without an insulin  pump.  We did discuss that if she did not gain better control on the pump, to refer her back to the transplant clinic at [facility]. -Since last visit, she started back on the mobi pump. CGM interpretation: -At today's visit, we reviewed her CGM downloads: It appears that 16% of values are in target range (goal >70%), while 84% are higher than 180 (goal <25%), and 0% are lower than 70 (goal <4%).  The calculated average blood sugar is 263.  The projected HbA1c for the next 3 months (GMI) is 9.6%. -Reviewing the CGM trends, sugars appear to be slightly better, fluctuating still above the target range, with slightly better values overnight, mostly fluctuating within 180 and 250, but significant increase in blood sugars later, after meals.  Upon questioning, she is frequently getting out of the automatic mode and switch to manual mode that she feels that the pump is giving her too much insulin .  We discussed that this happens for a reason, since her sugars are so high.  I did explain how the control IQ algorithm works and how it learns her blood sugar patterns.  I advised her to try to let the pump stay in the auto mode for as long as possible.  Also, she is not bolusing correctly before meals.  She relaxed her ICR too much since last visit, from 1: 8-1: 14, and we discussed that this is not enough.  I recommended to  strengthened him to at least 1: 10 and bolus before every meal.  I also recommended to increase her basal rate at night, as she has lower basal rates compared to the ones recommended at last visit.  We also strengthened her insulin  sensitivity factor slightly. -  I advised her to: Patient Instructions  Use the following mobi settings: - basal rates: 12 am: 0.4 >> 0.5 8 am: 0.5 Max basal rate: 2.5 - ICR: 12 am: 1:14 >> 1:10 - target: 120 - ISF: 70 >> 60 - Active Insulin  Time: 4-5h  Please enter all carbs into the pump at the beginning of the meal.  Please return in 2-3 months.  - advised to check sugars at different times of the day - 4x a day, rotating check times - advised for yearly eye exams >> she is UTD - return to clinic in 2-3 months  2. HL - Latest lipid panel was reviewed from 12/08/2023: 198/68/80/105: LDL above target, otherwise fractions at goal -She is on Lipitor 20 mg daily without  side effects  3.  Overweight - out of the in the overweight category based on BMI - She was on Mounjaro  5 mg weekly, but she lost a lot of weight, down to 111 pounds and she had to stop.  She then came off Mounjaro  now but she would be interested in restarting.  We did discuss that this was not covered by insurance for patients with type 1 diabetes but she mentioned that she was initially diagnosed with LADA.  I do not have this investigation since this was done in [place] when she was living there, however, we discussed that we could repeat her C-peptide if sugars improve.  However, due to her very brittle diabetes, my suspicion is for classical type 1 diabetes. - At today's visit, due to the weight gain, she tells me she may be interested in restarting Mounjaro  again.  We discussed about waiting until next visit, since she just started exercise and at that time we can start a low-dose, if necessary.  Lela Fendt, MD PhD [facility]

## 2024-12-29 ENCOUNTER — Ambulatory Visit

## 2024-12-30 ENCOUNTER — Ambulatory Visit

## 2025-01-26 ENCOUNTER — Ambulatory Visit

## 2025-01-27 ENCOUNTER — Ambulatory Visit

## 2025-03-25 ENCOUNTER — Ambulatory Visit: Admitting: Internal Medicine
